# Patient Record
Sex: Male | Born: 1985 | Race: White | Hispanic: No | Marital: Single | Smoking: Current every day smoker
Health system: Southern US, Community
[De-identification: ages and names within clinical notes are randomized; demographics above are authoritative.]

## PROBLEM LIST (undated history)

## (undated) DIAGNOSIS — F419 Anxiety disorder, unspecified: Secondary | ICD-10-CM

## (undated) DIAGNOSIS — K5900 Constipation, unspecified: Secondary | ICD-10-CM

## (undated) DIAGNOSIS — R569 Unspecified convulsions: Secondary | ICD-10-CM

## (undated) DIAGNOSIS — S39012A Strain of muscle, fascia and tendon of lower back, initial encounter: Secondary | ICD-10-CM

## (undated) HISTORY — DX: Constipation, unspecified: K59.00

## (undated) HISTORY — DX: Strain of muscle, fascia and tendon of lower back, initial encounter: S39.012A

## (undated) HISTORY — PX: OTHER SURGICAL HISTORY: SHX169

## (undated) HISTORY — DX: Unspecified convulsions: R56.9

## (undated) HISTORY — DX: Anxiety disorder, unspecified: F41.9

---

## 2003-06-11 ENCOUNTER — Ambulatory Visit (HOSPITAL_COMMUNITY): Admission: RE | Admit: 2003-06-11 | Discharge: 2003-06-11 | Payer: Self-pay | Admitting: Family Medicine

## 2004-09-22 ENCOUNTER — Emergency Department (HOSPITAL_COMMUNITY): Admission: EM | Admit: 2004-09-22 | Discharge: 2004-09-22 | Payer: Self-pay | Admitting: *Deleted

## 2006-03-04 IMAGING — CR DG CHEST 2V
3 series · 3 of 3 positions shown · non-contrast
Comparison: none

CLINICAL DATA: Short of breath.
 CHEST - 2 VIEW:
 The heart size and mediastinal contours are within normal limits.  Both lungs are clear.  The visualized skeletal structures are unremarkable.

[view not recorded (1 of 3)]
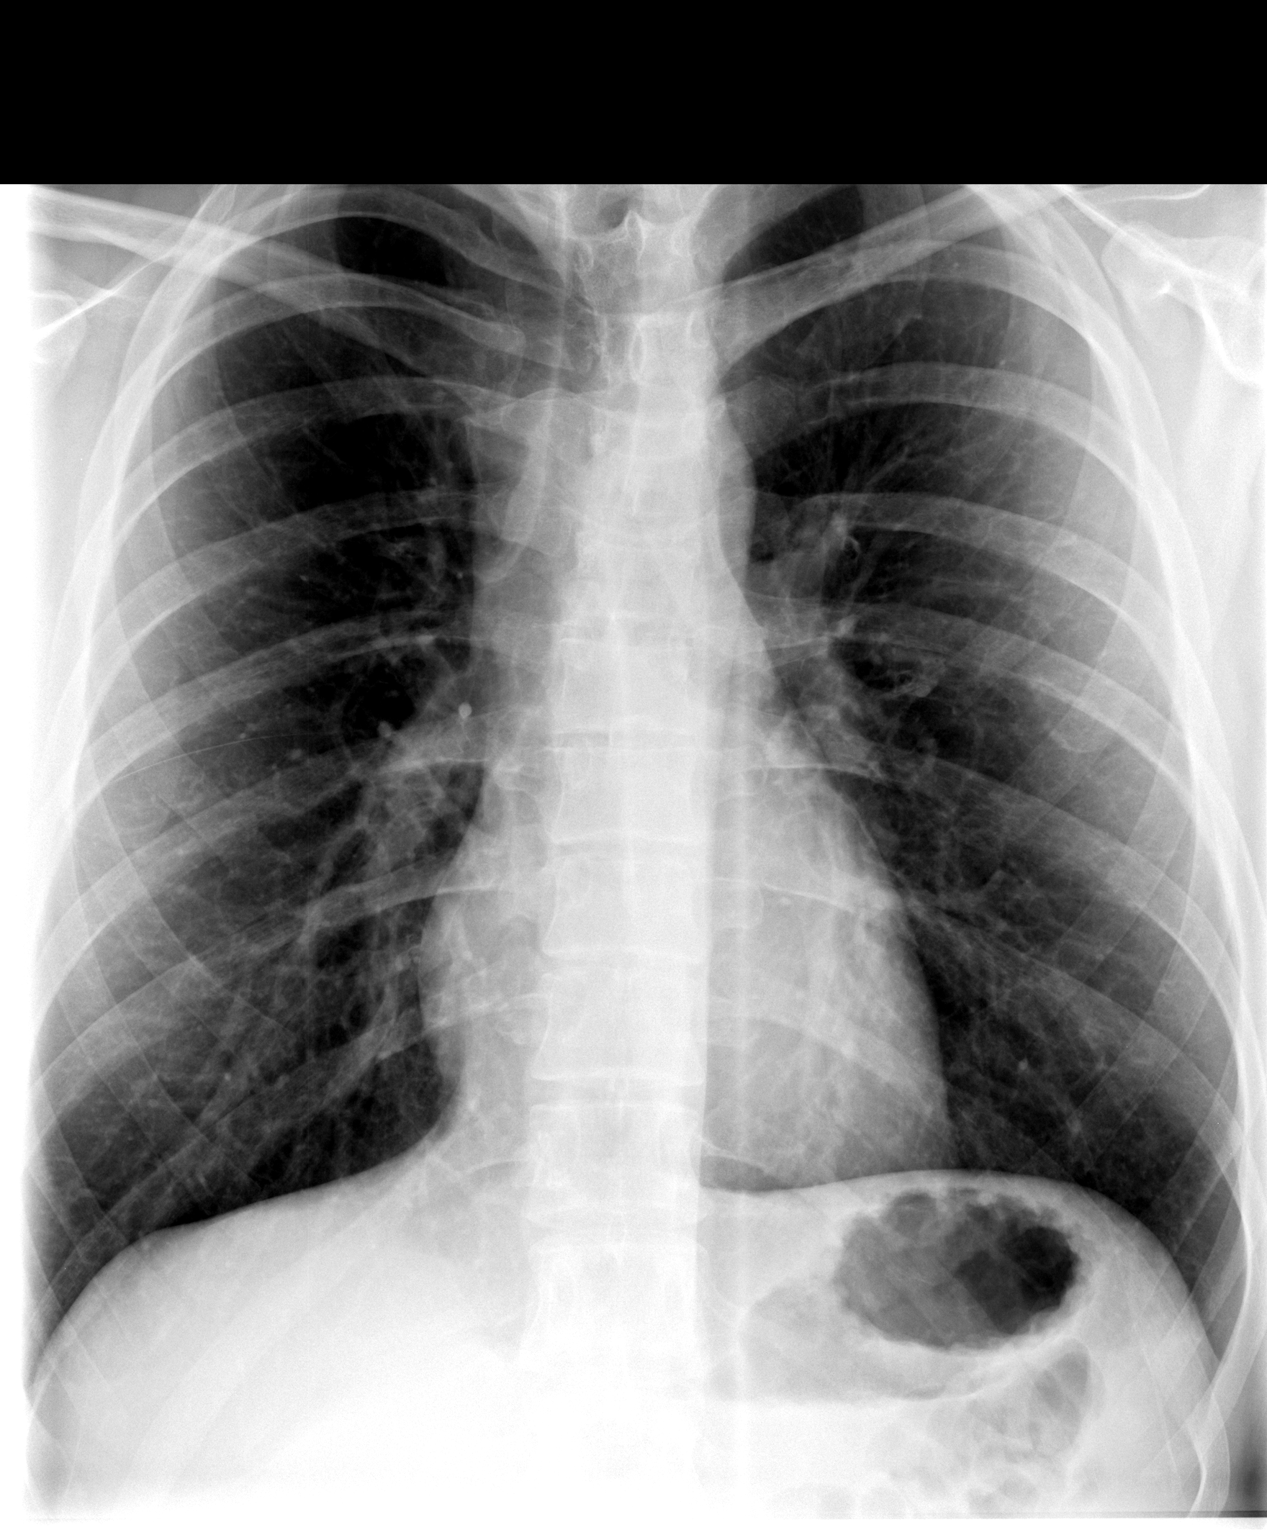

[view not recorded (2 of 3)]
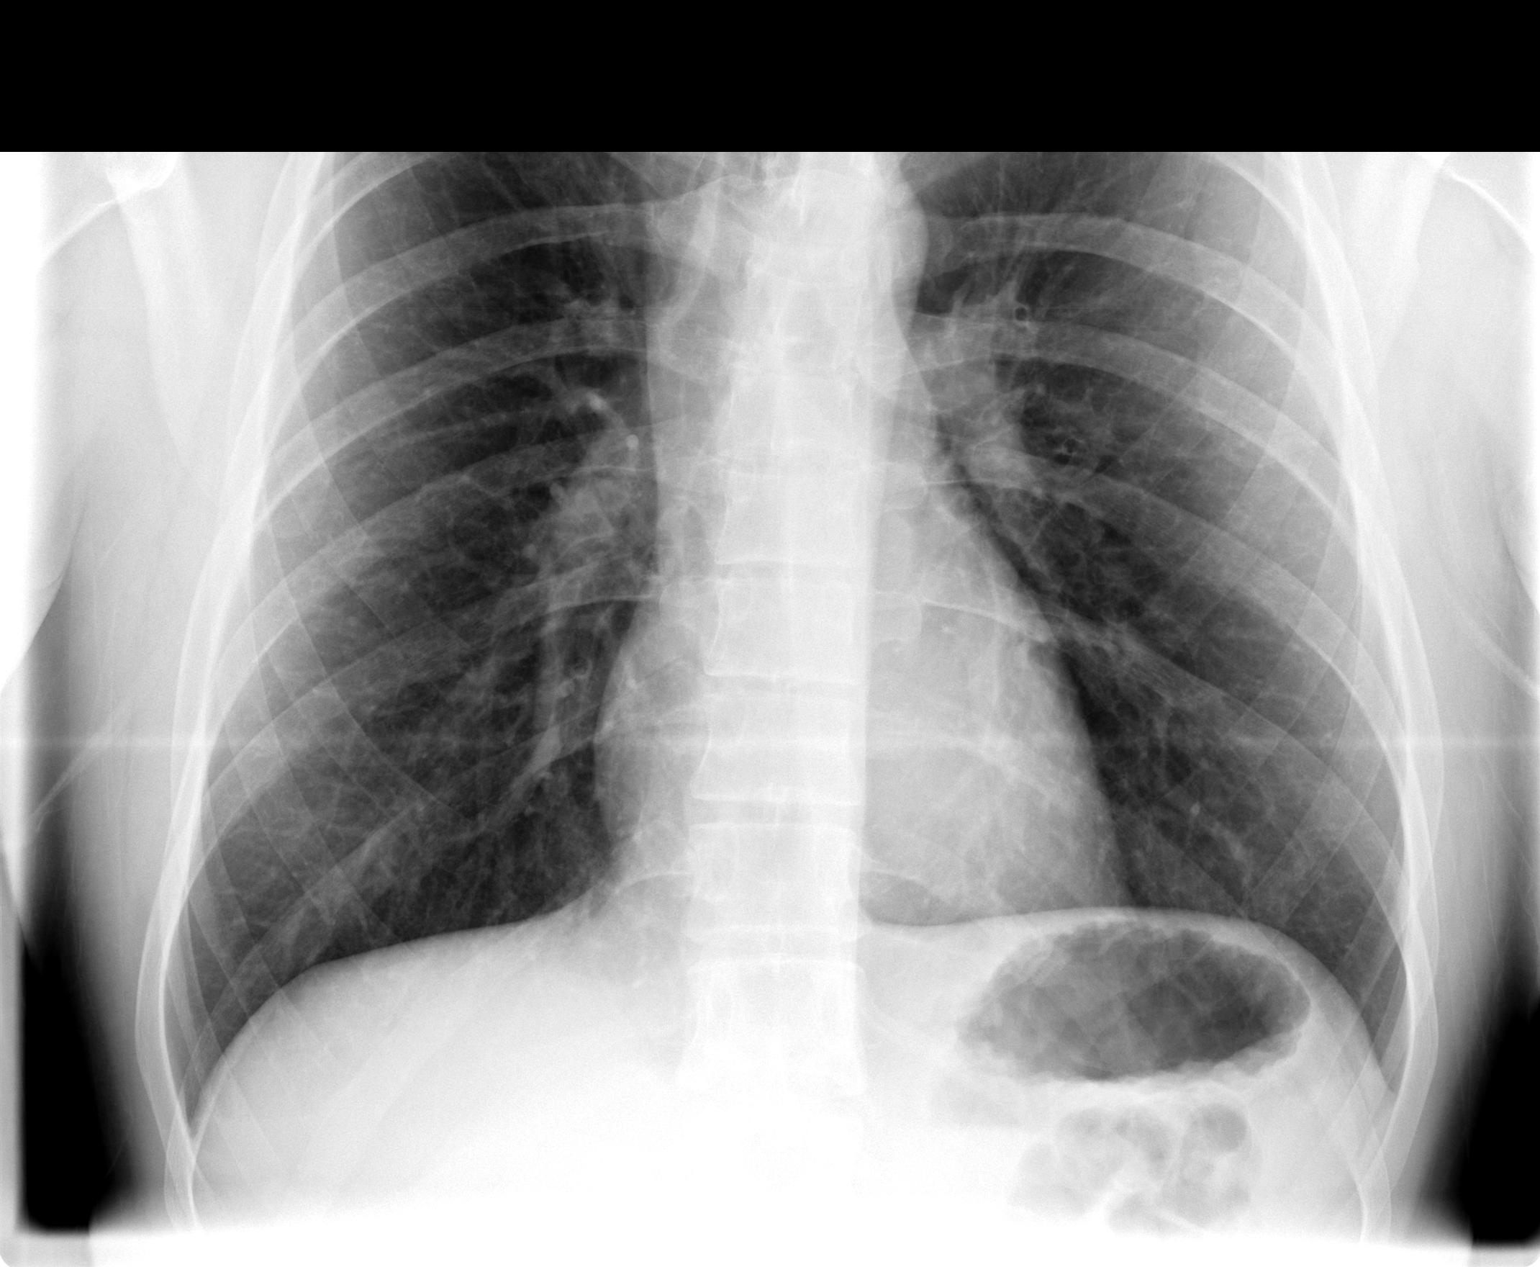

[view not recorded (3 of 3)]
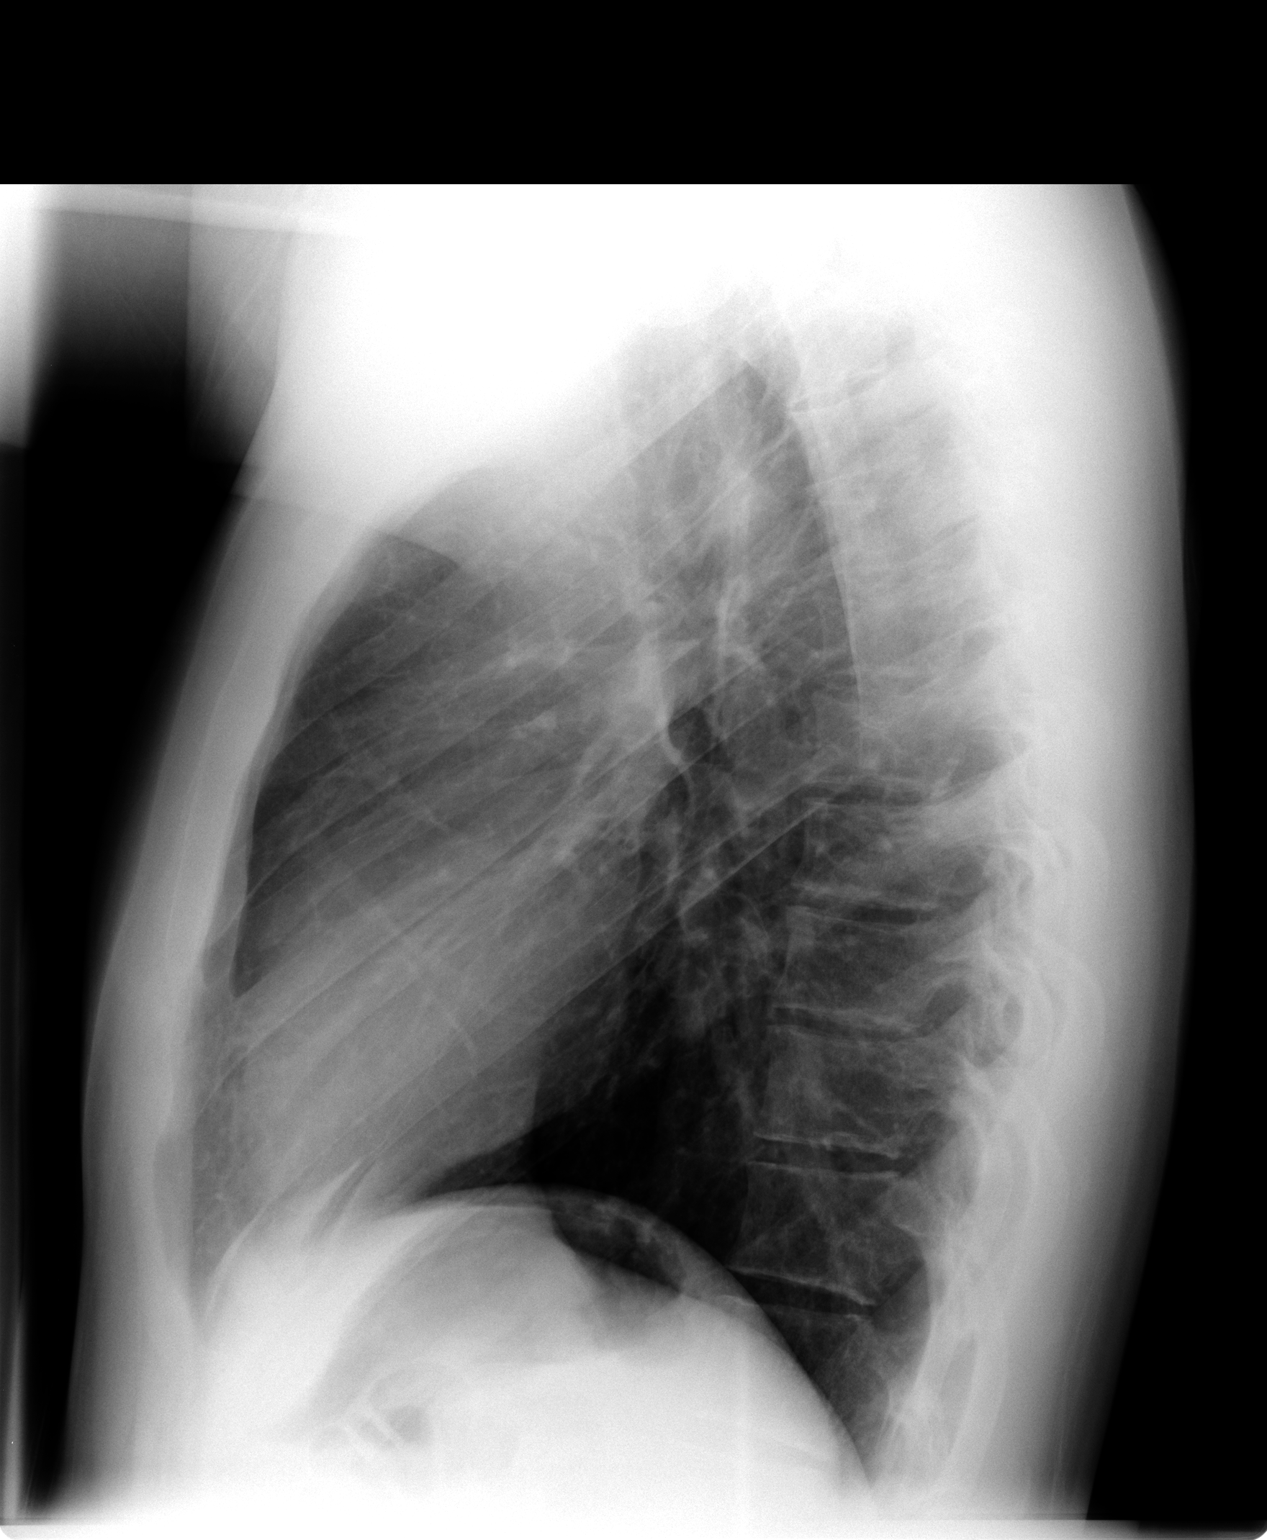

[3 of 3 positions shown; findings below may reference images not displayed]

IMPRESSION: No active cardiopulmonary disease.

## 2008-09-14 ENCOUNTER — Emergency Department (HOSPITAL_COMMUNITY): Admission: EM | Admit: 2008-09-14 | Discharge: 2008-09-15 | Payer: Self-pay | Admitting: Emergency Medicine

## 2010-08-01 LAB — URINALYSIS, ROUTINE W REFLEX MICROSCOPIC
Glucose, UA: NEGATIVE mg/dL
Hgb urine dipstick: NEGATIVE
Nitrite: NEGATIVE
Protein, ur: NEGATIVE mg/dL
Specific Gravity, Urine: 1.031 — ABNORMAL HIGH (ref 1.005–1.030)
Urobilinogen, UA: 0.2 mg/dL (ref 0.0–1.0)
pH: 6 (ref 5.0–8.0)

## 2010-08-01 LAB — CBC
HCT: 47.1 % (ref 39.0–52.0)
Hemoglobin: 15.8 g/dL (ref 13.0–17.0)
MCHC: 33.4 g/dL (ref 30.0–36.0)
MCV: 89.9 fL (ref 78.0–100.0)
Platelets: 196 10*3/uL (ref 150–400)
RBC: 5.24 MIL/uL (ref 4.22–5.81)
RDW: 14.2 % (ref 11.5–15.5)
WBC: 13.9 10*3/uL — ABNORMAL HIGH (ref 4.0–10.5)

## 2010-08-01 LAB — COMPREHENSIVE METABOLIC PANEL
ALT: 16 U/L (ref 0–53)
AST: 18 U/L (ref 0–37)
Albumin: 4.3 g/dL (ref 3.5–5.2)
Alkaline Phosphatase: 52 U/L (ref 39–117)
BUN: 9 mg/dL (ref 6–23)
CO2: 28 mEq/L (ref 19–32)
Calcium: 9.4 mg/dL (ref 8.4–10.5)
Chloride: 103 mEq/L (ref 96–112)
Creatinine, Ser: 1.16 mg/dL (ref 0.4–1.5)
GFR calc Af Amer: >60 mL/min (ref >60)
GFR calc non Af Amer: >60 mL/min (ref >60)
Glucose, Bld: 93 mg/dL (ref 70–99)
Potassium: 3.7 mEq/L (ref 3.5–5.1)
Sodium: 137 mEq/L (ref 135–145)
Total Bilirubin: 0.8 mg/dL (ref 0.3–1.2)
Total Protein: 7.1 g/dL (ref 6.0–8.3)

## 2010-08-01 LAB — DIFFERENTIAL
Basophils Absolute: 0.1 10*3/uL (ref 0.0–0.1)
Basophils Relative: 1 % (ref 0–1)
Eosinophils Absolute: 0.1 10*3/uL (ref 0.0–0.7)
Eosinophils Relative: 1 % (ref 0–5)
Lymphocytes Relative: 13 % (ref 12–46)
Lymphs Abs: 1.8 10*3/uL (ref 0.7–4.0)
Monocytes Absolute: 0.9 10*3/uL (ref 0.1–1.0)
Monocytes Relative: 6 % (ref 3–12)
Neutro Abs: 11.1 10*3/uL — ABNORMAL HIGH (ref 1.7–7.7)
Neutrophils Relative %: 80 % — ABNORMAL HIGH (ref 43–77)

## 2010-08-01 LAB — RAPID URINE DRUG SCREEN, HOSP PERFORMED
Amphetamines: POSITIVE — AB
Barbiturates: NOT DETECTED
Benzodiazepines: POSITIVE — AB
Cocaine: POSITIVE — AB
Opiates: POSITIVE — AB
Tetrahydrocannabinol: POSITIVE — AB

## 2010-08-01 LAB — ETHANOL: Alcohol, Ethyl (B): <5 mg/dL (ref 0–10)

## 2012-11-06 ENCOUNTER — Encounter: Payer: Self-pay | Admitting: Neurology

## 2012-11-06 ENCOUNTER — Ambulatory Visit (INDEPENDENT_AMBULATORY_CARE_PROVIDER_SITE_OTHER): Payer: 59 | Admitting: Neurology

## 2012-11-06 VITALS — BP 113/75 | HR 71 | Ht 74.0 in | Wt 205.5 lb

## 2012-11-06 DIAGNOSIS — R569 Unspecified convulsions: Secondary | ICD-10-CM | POA: Insufficient documentation

## 2012-11-06 DIAGNOSIS — F419 Anxiety disorder, unspecified: Secondary | ICD-10-CM | POA: Insufficient documentation

## 2012-11-06 DIAGNOSIS — S39012A Strain of muscle, fascia and tendon of lower back, initial encounter: Secondary | ICD-10-CM | POA: Insufficient documentation

## 2012-11-06 DIAGNOSIS — G8929 Other chronic pain: Secondary | ICD-10-CM

## 2012-11-06 DIAGNOSIS — M545 Low back pain, unspecified: Secondary | ICD-10-CM

## 2012-11-06 MED ORDER — DIVALPROEX SODIUM ER 500 MG PO TB24: 500.0000 mg | ORAL_TABLET | Freq: Every day | ORAL | Status: DC

## 2012-11-06 NOTE — Patient Instructions (Addendum)
Seizure, Adult A seizure is abnormal electrical activity in the brain. Seizures can cause a change in attention or behavior (altered mental status). Seizures often involve uncontrollable shaking (convulsions). Seizures usually last from 30 seconds to 2 minutes. Epilepsy is a brain disorder in which a patient has repeated seizures over time. CAUSES  There are many different problems that can cause seizures. In some cases, no cause is found. Common causes of seizures include:  Head injuries.  Brain tumors.  Infections.  Imbalance of chemicals in the blood.  Kidney failure or liver failure.  Heart disease.  Drug abuse.  Stroke.  Withdrawal from certain drugs or alcohol.  Birth defects.  Malfunction of a neurosurgical device placed in the brain. SYMPTOMS  Symptoms vary depending on the part of the brain that is involved. Right before a seizure, you may have a warning (aura) that a seizure is about to occur. An aura may include the following symptoms:   Fear or anxiety.  Nausea.  Feeling like the room is spinning (vertigo).  Vision changes, such as seeing flashing lights or spots. Common symptoms during a seizure include:  Convulsions.  Drooling.  Rapid eye movements.  Grunting.  Loss of bladder and bowel control.  Bitter taste in the mouth. After a seizure, you may feel confused and sleepy. You may also have an injury resulting from convulsions during the seizure. DIAGNOSIS  Your caregiver will perform a physical exam and run some tests to determine the type and cause of your seizure. These tests may include:  Blood tests.  A lumbar puncture test. In this test, a small amount of fluid is removed from the spine and examined.  Electrocardiography (ECG). This test records the electrical activity in your heart.  Imaging tests, such as computed tomography (CT) scans or magnetic resonance imaging (MRI).  Electroencephalography (EEG). This test records the electrical  activity in your brain. TREATMENT  Seizures usually stop on their own. Treatment will depend on the cause of your seizure. In some cases, medicine may be given to prevent future seizures. HOME CARE INSTRUCTIONS   If you are given medicines, take them exactly as prescribed by your caregiver.  Keep all follow-up appointments as directed by your caregiver.  Do not swim or drive until your caregiver says it is okay.  Teach friends and family what to do if you have a seizure. They should:  Lay you on the ground to prevent a fall.  Put a cushion under your head.  Loosen any tight clothing around your neck.  Turn you on your side. If vomiting occurs, this helps keep your airway clear.  Stay with you until you recover. SEEK IMMEDIATE MEDICAL CARE IF:  The seizure lasts longer than 2 to 5 minutes.  The seizure is severe or the person does not wake up after the seizure.  The person has altered mental status. Drive the person to the emergency department or call your local emergency services (911 in [place] MAKE SURE YOU:  Understand these instructions.  Will watch your condition.  Will get help right away if you are not doing well or get worse. Document Released: 04/06/2000 Document Revised: 07/02/2011 Document Reviewed: 03/28/2011 [facility] Patient Information 2014 [place], [place]. Electroencephalography Your caregiver has ordered an EEG for you. This is a test which records the electrical activity (brain waves) in your brain. It gives information about how your brain is working. It cannot read your thoughts, ideas, or knowledge. The test usually lasts for an hour and one  half. PREPARATION FOR EXAM  Your hair must be clean and dry.  Do not use hair spray, oils, or tease your hair the day of the test.  Take your medicine unless your caregiver has instructed otherwise.  Eat regularly unless instructed otherwise.  Avoid caffeine four hours prior to test.  If your caregiver has  ordered a "sleep deprived EEG", you may be asked to sleep less or stay awake the night before the procedure.  Other special instructions may be given. METHOD OF TESTING You will be seated in a comfortable chair and small metal discs called electrodes will be attached to your head with an adhesive (like glue). These electrodes pick up the electrical activity of your brain and are amplified in the electroencephalograph machine. These signals are then printed out on paper. There may be minor, very minimal discomfort associated with electrode placement. DURING THE TEST YOU WILL BE ASKED TO:  Lie quietly and relax.  Open and close your eyes.  Breathe deeply for three minutes.  Look at a flashing light for a short period of time. FOLLOWING THE TEST This is a complex test that may take some time to interpret. You will not find out the results on the day of the test. The electrodes will be removed with a solvent solution such as acetone or fingernail polish remover. You may then return to normal activities. A neurologist (nerve disorder specialist) will interpret your test and send a report to your caregiver. Find out how you are to receive your results. Remember it is your responsibility to obtain your results. Do not assume that everything is normal simply because you have not heard from your caregiver. Document Released: 04/06/2000 Document Revised: 07/02/2011 Document Reviewed: 06/09/2008 [facility] Patient Information 2014 [place], [place].
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
## 2013-05-14 ENCOUNTER — Ambulatory Visit: Payer: 59 | Admitting: Neurology

## 2015-07-29 DIAGNOSIS — F411 Generalized anxiety disorder: Secondary | ICD-10-CM | POA: Diagnosis not present

## 2015-07-29 DIAGNOSIS — Z6821 Body mass index (BMI) 21.0-21.9, adult: Secondary | ICD-10-CM | POA: Diagnosis not present

## 2015-08-12 DIAGNOSIS — R509 Fever, unspecified: Secondary | ICD-10-CM | POA: Diagnosis not present

## 2015-08-12 DIAGNOSIS — Z6820 Body mass index (BMI) 20.0-20.9, adult: Secondary | ICD-10-CM | POA: Diagnosis not present

## 2015-08-12 DIAGNOSIS — R1110 Vomiting, unspecified: Secondary | ICD-10-CM | POA: Diagnosis not present

## 2015-10-26 DIAGNOSIS — F411 Generalized anxiety disorder: Secondary | ICD-10-CM | POA: Diagnosis not present

## 2015-10-26 DIAGNOSIS — Z6821 Body mass index (BMI) 21.0-21.9, adult: Secondary | ICD-10-CM | POA: Diagnosis not present

## 2016-01-02 DIAGNOSIS — L02411 Cutaneous abscess of right axilla: Secondary | ICD-10-CM | POA: Diagnosis not present

## 2016-01-25 DIAGNOSIS — Z1389 Encounter for screening for other disorder: Secondary | ICD-10-CM | POA: Diagnosis not present

## 2016-01-25 DIAGNOSIS — Z6821 Body mass index (BMI) 21.0-21.9, adult: Secondary | ICD-10-CM | POA: Diagnosis not present

## 2016-01-25 DIAGNOSIS — F411 Generalized anxiety disorder: Secondary | ICD-10-CM | POA: Diagnosis not present

## 2016-03-14 DIAGNOSIS — Z23 Encounter for immunization: Secondary | ICD-10-CM | POA: Diagnosis not present

## 2016-05-02 DIAGNOSIS — F411 Generalized anxiety disorder: Secondary | ICD-10-CM | POA: Diagnosis not present

## 2016-05-02 DIAGNOSIS — Z6821 Body mass index (BMI) 21.0-21.9, adult: Secondary | ICD-10-CM | POA: Diagnosis not present

## 2016-05-02 DIAGNOSIS — Z202 Contact with and (suspected) exposure to infections with a predominantly sexual mode of transmission: Secondary | ICD-10-CM | POA: Diagnosis not present

## 2016-05-02 DIAGNOSIS — N4889 Other specified disorders of penis: Secondary | ICD-10-CM | POA: Diagnosis not present

## 2016-05-05 DIAGNOSIS — Z202 Contact with and (suspected) exposure to infections with a predominantly sexual mode of transmission: Secondary | ICD-10-CM | POA: Diagnosis not present

## 2016-05-30 DIAGNOSIS — L03031 Cellulitis of right toe: Secondary | ICD-10-CM | POA: Diagnosis not present

## 2016-05-30 DIAGNOSIS — Z6821 Body mass index (BMI) 21.0-21.9, adult: Secondary | ICD-10-CM | POA: Diagnosis not present

## 2016-05-30 DIAGNOSIS — F411 Generalized anxiety disorder: Secondary | ICD-10-CM | POA: Diagnosis not present

## 2016-06-12 DIAGNOSIS — Z6821 Body mass index (BMI) 21.0-21.9, adult: Secondary | ICD-10-CM | POA: Diagnosis not present

## 2016-06-12 DIAGNOSIS — L03031 Cellulitis of right toe: Secondary | ICD-10-CM | POA: Diagnosis not present

## 2016-06-21 DIAGNOSIS — M79674 Pain in right toe(s): Secondary | ICD-10-CM | POA: Diagnosis not present

## 2016-06-21 DIAGNOSIS — L03031 Cellulitis of right toe: Secondary | ICD-10-CM | POA: Diagnosis not present

## 2016-06-21 DIAGNOSIS — L600 Ingrowing nail: Secondary | ICD-10-CM | POA: Diagnosis not present

## 2016-06-21 DIAGNOSIS — M79671 Pain in right foot: Secondary | ICD-10-CM | POA: Diagnosis not present

## 2016-07-17 DIAGNOSIS — F411 Generalized anxiety disorder: Secondary | ICD-10-CM | POA: Diagnosis not present

## 2016-07-17 DIAGNOSIS — J111 Influenza due to unidentified influenza virus with other respiratory manifestations: Secondary | ICD-10-CM | POA: Diagnosis not present

## 2016-07-17 DIAGNOSIS — R634 Abnormal weight loss: Secondary | ICD-10-CM | POA: Diagnosis not present

## 2016-07-17 DIAGNOSIS — Z6820 Body mass index (BMI) 20.0-20.9, adult: Secondary | ICD-10-CM | POA: Diagnosis not present

## 2016-07-23 DIAGNOSIS — R05 Cough: Secondary | ICD-10-CM | POA: Diagnosis not present

## 2016-07-23 DIAGNOSIS — J1089 Influenza due to other identified influenza virus with other manifestations: Secondary | ICD-10-CM | POA: Diagnosis not present

## 2016-07-23 DIAGNOSIS — J111 Influenza due to unidentified influenza virus with other respiratory manifestations: Secondary | ICD-10-CM | POA: Diagnosis not present

## 2016-08-27 DIAGNOSIS — Z6821 Body mass index (BMI) 21.0-21.9, adult: Secondary | ICD-10-CM | POA: Diagnosis not present

## 2016-08-27 DIAGNOSIS — F411 Generalized anxiety disorder: Secondary | ICD-10-CM | POA: Diagnosis not present

## 2016-09-13 DIAGNOSIS — D485 Neoplasm of uncertain behavior of skin: Secondary | ICD-10-CM | POA: Diagnosis not present

## 2016-09-13 DIAGNOSIS — D229 Melanocytic nevi, unspecified: Secondary | ICD-10-CM

## 2016-09-13 HISTORY — DX: Melanocytic nevi, unspecified: D22.9

## 2016-10-11 DIAGNOSIS — Z6821 Body mass index (BMI) 21.0-21.9, adult: Secondary | ICD-10-CM | POA: Diagnosis not present

## 2016-10-11 DIAGNOSIS — H11422 Conjunctival edema, left eye: Secondary | ICD-10-CM | POA: Diagnosis not present

## 2016-10-11 DIAGNOSIS — H44131 Sympathetic uveitis, right eye: Secondary | ICD-10-CM | POA: Diagnosis not present

## 2016-12-19 DIAGNOSIS — F411 Generalized anxiety disorder: Secondary | ICD-10-CM | POA: Diagnosis not present

## 2016-12-19 DIAGNOSIS — Z6823 Body mass index (BMI) 23.0-23.9, adult: Secondary | ICD-10-CM | POA: Diagnosis not present

## 2017-01-16 DIAGNOSIS — L03032 Cellulitis of left toe: Secondary | ICD-10-CM | POA: Diagnosis not present

## 2017-01-16 DIAGNOSIS — L600 Ingrowing nail: Secondary | ICD-10-CM | POA: Diagnosis not present

## 2017-01-16 DIAGNOSIS — M79672 Pain in left foot: Secondary | ICD-10-CM | POA: Diagnosis not present

## 2017-01-16 DIAGNOSIS — M79675 Pain in left toe(s): Secondary | ICD-10-CM | POA: Diagnosis not present

## 2017-01-30 DIAGNOSIS — M79672 Pain in left foot: Secondary | ICD-10-CM | POA: Diagnosis not present

## 2017-01-30 DIAGNOSIS — L600 Ingrowing nail: Secondary | ICD-10-CM | POA: Diagnosis not present

## 2017-01-30 DIAGNOSIS — M79675 Pain in left toe(s): Secondary | ICD-10-CM | POA: Diagnosis not present

## 2017-01-30 DIAGNOSIS — L03032 Cellulitis of left toe: Secondary | ICD-10-CM | POA: Diagnosis not present

## 2017-03-28 DIAGNOSIS — R112 Nausea with vomiting, unspecified: Secondary | ICD-10-CM | POA: Diagnosis not present

## 2017-03-28 DIAGNOSIS — J069 Acute upper respiratory infection, unspecified: Secondary | ICD-10-CM | POA: Diagnosis not present

## 2017-03-28 DIAGNOSIS — R509 Fever, unspecified: Secondary | ICD-10-CM | POA: Diagnosis not present

## 2017-03-28 DIAGNOSIS — Z6823 Body mass index (BMI) 23.0-23.9, adult: Secondary | ICD-10-CM | POA: Diagnosis not present

## 2017-04-09 DIAGNOSIS — Z6824 Body mass index (BMI) 24.0-24.9, adult: Secondary | ICD-10-CM | POA: Diagnosis not present

## 2017-04-09 DIAGNOSIS — F411 Generalized anxiety disorder: Secondary | ICD-10-CM | POA: Diagnosis not present

## 2017-05-31 DIAGNOSIS — F1121 Opioid dependence, in remission: Secondary | ICD-10-CM | POA: Diagnosis not present

## 2017-05-31 DIAGNOSIS — Z79891 Long term (current) use of opiate analgesic: Secondary | ICD-10-CM | POA: Diagnosis not present

## 2017-06-03 DIAGNOSIS — F1121 Opioid dependence, in remission: Secondary | ICD-10-CM | POA: Diagnosis not present

## 2017-06-03 DIAGNOSIS — Z79891 Long term (current) use of opiate analgesic: Secondary | ICD-10-CM | POA: Diagnosis not present

## 2017-06-25 DIAGNOSIS — Z79899 Other long term (current) drug therapy: Secondary | ICD-10-CM | POA: Diagnosis not present

## 2017-07-01 DIAGNOSIS — J029 Acute pharyngitis, unspecified: Secondary | ICD-10-CM | POA: Diagnosis not present

## 2017-07-01 DIAGNOSIS — J111 Influenza due to unidentified influenza virus with other respiratory manifestations: Secondary | ICD-10-CM | POA: Diagnosis not present

## 2017-07-01 DIAGNOSIS — Z6824 Body mass index (BMI) 24.0-24.9, adult: Secondary | ICD-10-CM | POA: Diagnosis not present

## 2017-07-02 DIAGNOSIS — Z79899 Other long term (current) drug therapy: Secondary | ICD-10-CM | POA: Diagnosis not present

## 2017-07-23 DIAGNOSIS — Z79899 Other long term (current) drug therapy: Secondary | ICD-10-CM | POA: Diagnosis not present

## 2017-07-30 DIAGNOSIS — Z79899 Other long term (current) drug therapy: Secondary | ICD-10-CM | POA: Diagnosis not present

## 2017-08-21 DIAGNOSIS — Z79891 Long term (current) use of opiate analgesic: Secondary | ICD-10-CM | POA: Diagnosis not present

## 2017-08-28 DIAGNOSIS — Z79891 Long term (current) use of opiate analgesic: Secondary | ICD-10-CM | POA: Diagnosis not present

## 2017-09-05 DIAGNOSIS — H11421 Conjunctival edema, right eye: Secondary | ICD-10-CM | POA: Diagnosis not present

## 2017-10-04 DIAGNOSIS — F411 Generalized anxiety disorder: Secondary | ICD-10-CM | POA: Diagnosis not present

## 2017-10-04 DIAGNOSIS — Z6825 Body mass index (BMI) 25.0-25.9, adult: Secondary | ICD-10-CM | POA: Diagnosis not present

## 2017-10-21 DIAGNOSIS — Z833 Family history of diabetes mellitus: Secondary | ICD-10-CM | POA: Diagnosis not present

## 2017-10-21 DIAGNOSIS — Z888 Allergy status to other drugs, medicaments and biological substances status: Secondary | ICD-10-CM | POA: Diagnosis not present

## 2017-10-21 DIAGNOSIS — R112 Nausea with vomiting, unspecified: Secondary | ICD-10-CM | POA: Diagnosis not present

## 2017-10-21 DIAGNOSIS — K2970 Gastritis, unspecified, without bleeding: Secondary | ICD-10-CM | POA: Diagnosis not present

## 2017-10-21 DIAGNOSIS — F17200 Nicotine dependence, unspecified, uncomplicated: Secondary | ICD-10-CM | POA: Diagnosis not present

## 2017-10-21 DIAGNOSIS — R509 Fever, unspecified: Secondary | ICD-10-CM | POA: Diagnosis not present

## 2017-10-21 DIAGNOSIS — Z811 Family history of alcohol abuse and dependence: Secondary | ICD-10-CM | POA: Diagnosis not present

## 2017-10-21 DIAGNOSIS — Z820 Family history of epilepsy and other diseases of the nervous system: Secondary | ICD-10-CM | POA: Diagnosis not present

## 2017-10-21 DIAGNOSIS — Z809 Family history of malignant neoplasm, unspecified: Secondary | ICD-10-CM | POA: Diagnosis not present

## 2017-10-21 DIAGNOSIS — Z8249 Family history of ischemic heart disease and other diseases of the circulatory system: Secondary | ICD-10-CM | POA: Diagnosis not present

## 2017-10-21 DIAGNOSIS — K529 Noninfective gastroenteritis and colitis, unspecified: Secondary | ICD-10-CM | POA: Diagnosis not present

## 2017-10-21 DIAGNOSIS — R1110 Vomiting, unspecified: Secondary | ICD-10-CM | POA: Diagnosis not present

## 2017-10-21 DIAGNOSIS — Z8489 Family history of other specified conditions: Secondary | ICD-10-CM | POA: Diagnosis not present

## 2017-10-21 DIAGNOSIS — R109 Unspecified abdominal pain: Secondary | ICD-10-CM | POA: Diagnosis not present

## 2017-10-21 DIAGNOSIS — Z818 Family history of other mental and behavioral disorders: Secondary | ICD-10-CM | POA: Diagnosis not present

## 2017-10-21 DIAGNOSIS — F1910 Other psychoactive substance abuse, uncomplicated: Secondary | ICD-10-CM | POA: Diagnosis not present

## 2017-10-21 DIAGNOSIS — G894 Chronic pain syndrome: Secondary | ICD-10-CM | POA: Diagnosis not present

## 2017-10-21 DIAGNOSIS — Z79899 Other long term (current) drug therapy: Secondary | ICD-10-CM | POA: Diagnosis not present

## 2017-10-22 DIAGNOSIS — R509 Fever, unspecified: Secondary | ICD-10-CM | POA: Diagnosis not present

## 2017-10-22 DIAGNOSIS — K529 Noninfective gastroenteritis and colitis, unspecified: Secondary | ICD-10-CM | POA: Diagnosis not present

## 2017-10-23 DIAGNOSIS — K529 Noninfective gastroenteritis and colitis, unspecified: Secondary | ICD-10-CM | POA: Diagnosis not present

## 2017-10-23 DIAGNOSIS — R509 Fever, unspecified: Secondary | ICD-10-CM | POA: Diagnosis not present

## 2017-10-24 DIAGNOSIS — K529 Noninfective gastroenteritis and colitis, unspecified: Secondary | ICD-10-CM | POA: Diagnosis not present

## 2017-10-24 DIAGNOSIS — G894 Chronic pain syndrome: Secondary | ICD-10-CM | POA: Diagnosis not present

## 2017-10-24 DIAGNOSIS — F1910 Other psychoactive substance abuse, uncomplicated: Secondary | ICD-10-CM | POA: Diagnosis not present

## 2017-10-24 DIAGNOSIS — R509 Fever, unspecified: Secondary | ICD-10-CM | POA: Diagnosis not present

## 2017-10-31 DIAGNOSIS — Z79891 Long term (current) use of opiate analgesic: Secondary | ICD-10-CM | POA: Diagnosis not present

## 2017-11-01 DIAGNOSIS — K529 Noninfective gastroenteritis and colitis, unspecified: Secondary | ICD-10-CM | POA: Diagnosis not present

## 2017-11-20 DIAGNOSIS — Z79891 Long term (current) use of opiate analgesic: Secondary | ICD-10-CM | POA: Diagnosis not present

## 2017-11-23 DIAGNOSIS — K529 Noninfective gastroenteritis and colitis, unspecified: Secondary | ICD-10-CM | POA: Diagnosis not present

## 2017-11-27 DIAGNOSIS — Z79891 Long term (current) use of opiate analgesic: Secondary | ICD-10-CM | POA: Diagnosis not present

## 2017-12-25 DIAGNOSIS — Z79891 Long term (current) use of opiate analgesic: Secondary | ICD-10-CM | POA: Diagnosis not present

## 2018-01-22 DIAGNOSIS — Z79891 Long term (current) use of opiate analgesic: Secondary | ICD-10-CM | POA: Diagnosis not present

## 2018-02-19 DIAGNOSIS — Z79891 Long term (current) use of opiate analgesic: Secondary | ICD-10-CM | POA: Diagnosis not present

## 2018-03-19 DIAGNOSIS — Z79891 Long term (current) use of opiate analgesic: Secondary | ICD-10-CM | POA: Diagnosis not present

## 2018-03-28 DIAGNOSIS — Z1331 Encounter for screening for depression: Secondary | ICD-10-CM | POA: Diagnosis not present

## 2018-03-28 DIAGNOSIS — F411 Generalized anxiety disorder: Secondary | ICD-10-CM | POA: Diagnosis not present

## 2018-03-28 DIAGNOSIS — Z23 Encounter for immunization: Secondary | ICD-10-CM | POA: Diagnosis not present

## 2018-03-28 DIAGNOSIS — G252 Other specified forms of tremor: Secondary | ICD-10-CM | POA: Diagnosis not present

## 2018-03-28 DIAGNOSIS — Z6827 Body mass index (BMI) 27.0-27.9, adult: Secondary | ICD-10-CM | POA: Diagnosis not present

## 2018-06-11 DIAGNOSIS — Z79891 Long term (current) use of opiate analgesic: Secondary | ICD-10-CM | POA: Diagnosis not present

## 2018-07-09 DIAGNOSIS — F1121 Opioid dependence, in remission: Secondary | ICD-10-CM | POA: Diagnosis not present

## 2018-07-09 DIAGNOSIS — Z79899 Other long term (current) drug therapy: Secondary | ICD-10-CM | POA: Diagnosis not present

## 2018-08-06 DIAGNOSIS — Z79899 Other long term (current) drug therapy: Secondary | ICD-10-CM | POA: Diagnosis not present

## 2018-09-03 DIAGNOSIS — F1121 Opioid dependence, in remission: Secondary | ICD-10-CM | POA: Diagnosis not present

## 2018-09-03 DIAGNOSIS — Z79899 Other long term (current) drug therapy: Secondary | ICD-10-CM | POA: Diagnosis not present

## 2018-09-03 DIAGNOSIS — F329 Major depressive disorder, single episode, unspecified: Secondary | ICD-10-CM | POA: Diagnosis not present

## 2018-09-23 DIAGNOSIS — F411 Generalized anxiety disorder: Secondary | ICD-10-CM | POA: Diagnosis not present

## 2018-09-23 DIAGNOSIS — Z6826 Body mass index (BMI) 26.0-26.9, adult: Secondary | ICD-10-CM | POA: Diagnosis not present

## 2018-10-01 DIAGNOSIS — Z79899 Other long term (current) drug therapy: Secondary | ICD-10-CM | POA: Diagnosis not present

## 2018-10-01 DIAGNOSIS — F329 Major depressive disorder, single episode, unspecified: Secondary | ICD-10-CM | POA: Diagnosis not present

## 2018-10-01 DIAGNOSIS — F411 Generalized anxiety disorder: Secondary | ICD-10-CM | POA: Diagnosis not present

## 2018-10-29 DIAGNOSIS — F329 Major depressive disorder, single episode, unspecified: Secondary | ICD-10-CM | POA: Diagnosis not present

## 2018-10-29 DIAGNOSIS — F1121 Opioid dependence, in remission: Secondary | ICD-10-CM | POA: Diagnosis not present

## 2018-11-26 DIAGNOSIS — F411 Generalized anxiety disorder: Secondary | ICD-10-CM | POA: Diagnosis not present

## 2018-11-26 DIAGNOSIS — F1120 Opioid dependence, uncomplicated: Secondary | ICD-10-CM | POA: Diagnosis not present

## 2018-11-26 DIAGNOSIS — Z79899 Other long term (current) drug therapy: Secondary | ICD-10-CM | POA: Diagnosis not present

## 2018-12-24 DIAGNOSIS — F1121 Opioid dependence, in remission: Secondary | ICD-10-CM | POA: Diagnosis not present

## 2018-12-24 DIAGNOSIS — F411 Generalized anxiety disorder: Secondary | ICD-10-CM | POA: Diagnosis not present

## 2018-12-24 DIAGNOSIS — F1120 Opioid dependence, uncomplicated: Secondary | ICD-10-CM | POA: Diagnosis not present

## 2018-12-24 DIAGNOSIS — Z79899 Other long term (current) drug therapy: Secondary | ICD-10-CM | POA: Diagnosis not present

## 2019-03-11 DIAGNOSIS — F411 Generalized anxiety disorder: Secondary | ICD-10-CM | POA: Diagnosis not present

## 2019-03-11 DIAGNOSIS — Z1331 Encounter for screening for depression: Secondary | ICD-10-CM | POA: Diagnosis not present

## 2019-03-11 DIAGNOSIS — Z202 Contact with and (suspected) exposure to infections with a predominantly sexual mode of transmission: Secondary | ICD-10-CM | POA: Diagnosis not present

## 2019-03-11 DIAGNOSIS — Z6825 Body mass index (BMI) 25.0-25.9, adult: Secondary | ICD-10-CM | POA: Diagnosis not present

## 2019-03-24 ENCOUNTER — Encounter: Payer: Self-pay | Admitting: Gastroenterology

## 2019-04-08 ENCOUNTER — Ambulatory Visit: Payer: BC Managed Care – PPO | Admitting: Gastroenterology

## 2019-04-08 ENCOUNTER — Encounter: Payer: Self-pay | Admitting: Gastroenterology

## 2019-04-08 ENCOUNTER — Encounter: Payer: Self-pay | Admitting: *Deleted

## 2019-04-08 ENCOUNTER — Other Ambulatory Visit: Payer: Self-pay

## 2019-04-08 ENCOUNTER — Ambulatory Visit: Payer: Self-pay | Admitting: Nurse Practitioner

## 2019-04-08 DIAGNOSIS — B182 Chronic viral hepatitis C: Secondary | ICD-10-CM | POA: Insufficient documentation

## 2019-04-08 NOTE — Progress Notes (Signed)
Primary Care Physician:  William Percy, MD Primary Gastroenterologist:  Dr. Oneida Jackson   Chief Complaint  Patient presents with  . Hepatitis C  . Elevated Hepatic Enzymes    HPI:   William Jackson is a 33 y.o. male presenting today at the request of Dr. Nadara Jackson due to elevated transaminases and positive Hep C antibody.   Labs from PCP dated Nov 2020 with BUN 12, creatinine 0.88, Tbili 0.8, Alk Phos 61, AST 135, ALT 231, positive Hep C antibody with positive qualitative RNA (no quantitative provided). Unknown genotype.   States he had donated blood in August and then found out he was positive for Hep C. Historically with prescription med addiction. No IV drug use. History of snorting meds 11 years ago. Stopped when found out his girlfriend was pregnant. Single dad of 1 year old with Down Syndrome. No tattoos, no prior blood transfusions.   Weaning off Suboxone. Concerned about health insurance and coverage due to high premiums. Notes Bristol stool #2, #1 at times. Working on own with OTC magnesium. If remembers to take, won't suffer from constipation.   No mental status changes, confusion, abdominal pain, jaundice.   Past Medical History:  Diagnosis Date  . Anxiety   . Constipation   . Lumbar spine strain   . Seizure [facility])     Past Surgical History:  Procedure Laterality Date  . none      Current Outpatient Medications  Medication Sig Dispense Refill  . ALPRAZolam (XANAX) 1 MG tablet Take 1 mg by mouth 3 (three) times daily.     . buprenorphine-naloxone (SUBOXONE) 8-2 mg SUBL SL tablet Place 0.5 tablets under the tongue 3 (three) times daily.     No current facility-administered medications for this visit.    Allergies as of 04/08/2019 - Review Complete 04/08/2019  Allergen Reaction Noted  . Penicillins  11/06/2012    Family History  Problem Relation Age of Onset  . Prostate cancer Father   . Colon cancer Neg Hx   . Colon polyps Neg Hx     Social History    Socioeconomic History  . Marital status: Single    Spouse name: Not on file  . Number of children: 1  . Years of education: 55  . Highest education level: Not on file  Occupational History    Comment: [employer], works in Paper  Tobacco Use  . Smoking status: Current Every Day Smoker  . Smokeless tobacco: Never Used  . Tobacco comment: vapes  Substance and Sexual Activity  . Alcohol use: No  . Drug use: No  . Sexual activity: Not on file  Other Topics Concern  . Not on file  Social History Narrative  . Not on file   Social Determinants of Health   Financial Resource Strain:   . Difficulty of Paying Living Expenses: Not on file  Food Insecurity:   . Worried About Charity fundraiser in the Last Year: Not on file  . Ran Out of Food in the Last Year: Not on file  Transportation Needs:   . Lack of Transportation (Medical): Not on file  . Lack of Transportation (Non-Medical): Not on file  Physical Activity:   . Days of Exercise per Week: Not on file  . Minutes of Exercise per Session: Not on file  Stress:   . Feeling of Stress : Not on file  Social Connections:   . Frequency of Communication with Friends and Family: Not on file  . Frequency  of Social Gatherings with Friends and Family: Not on file  . Attends Religious Services: Not on file  . Active Member of Clubs or Organizations: Not on file  . Attends Archivist Meetings: Not on file  . Marital Status: Not on file  Intimate Partner Violence:   . Fear of Current or Ex-Partner: Not on file  . Emotionally Abused: Not on file  . Physically Abused: Not on file  . Sexually Abused: Not on file    Review of Systems: Gen: Denies any fever, chills, fatigue, weight loss, lack of appetite.  CV: Denies chest pain, heart palpitations, peripheral edema, syncope.  Resp: Denies shortness of breath at rest or with exertion. Denies wheezing or cough.  GI: see HPI GU : Denies urinary burning, urinary frequency,  urinary hesitancy MS: Denies joint pain, muscle weakness, cramps, or limitation of movement.  Derm: Denies rash, itching, dry skin Psych: Denies depression, anxiety, memory loss, and confusion Heme: Denies bruising, bleeding, and enlarged lymph nodes.  Physical Exam: BP 108/66   Pulse 78   Temp (!) 96.8 F (36 C) (Temporal)   Ht _0  (1.905 m)   Wt 217 lb 9.6 oz (98.7 kg)   BMI 27.20 kg/m  General:   Alert and oriented. Pleasant and cooperative. Well-nourished and well-developed.  Head:  Normocephalic and atraumatic. Eyes:  Without icterus, sclera clear and conjunctiva pink.  Lungs:  Clear to auscultation bilaterally. No wheezes, rales, or rhonchi. No distress.  Heart:  S1, S2 present without murmurs appreciated.  Abdomen:  +BS, soft, non-tender and non-distended. No HSM noted. No guarding or rebound. No masses appreciated.  Rectal:  Deferred  Msk:  Symmetrical without gross deformities. Normal posture. Extremities:  Without edema. Neurologic:  Alert and  oriented x4;  grossly normal neurologically. Skin:  Intact without significant lesions or rashes. Psych:  Alert and cooperative. Normal mood and affect.  ASSESSMENT: William Jackson is a 32 y.o. male presenting today with a history of chronic Hepatitis C, unknown genotype, and desiring treatment. He is an excellent candidate, as he has remained without illicit drug use for 11 years and is highly motivated. Needs [place] elastography and additional labs prior to treatment. Hopefully, we can get this done in a timely manner and submit for approval in the next few weeks. I discussed with him the need for close follow-up, monitoring, serial blood work, and compliance with medication. He states understanding.    PLAN: [place] elastography Labs: HIV, Hep B serologies, Hep A, Hep C genotype, Hep C RNA, INR, CBC.  Further recommendations to follow   [name], PhD, ANP-BC [facility]

## 2019-04-08 NOTE — Patient Instructions (Signed)
Please have blood work done when you are able.  We are arranging an ultrasound in the near future.  We will submit to insurance for treatment once we get blood work and ultrasound back!  It was a pleasure to see you today. I want to create trusting relationships with patients to provide genuine, compassionate, and quality care. I value your feedback. If you receive a survey regarding your visit,  I greatly appreciate you taking time to fill this out.   [name], PhD, ANP-BC [facility]

## 2019-04-14 ENCOUNTER — Ambulatory Visit (HOSPITAL_COMMUNITY)
Admission: RE | Admit: 2019-04-14 | Discharge: 2019-04-14 | Disposition: A | Discharge status: Home / Self Care | Payer: BC Managed Care – PPO | Source: Ambulatory Visit | Attending: Gastroenterology | Admitting: Gastroenterology

## 2019-04-14 ENCOUNTER — Other Ambulatory Visit: Payer: Self-pay

## 2019-04-14 DIAGNOSIS — B182 Chronic viral hepatitis C: Secondary | ICD-10-CM | POA: Diagnosis not present

## 2019-04-22 ENCOUNTER — Telehealth: Payer: Self-pay | Admitting: Gastroenterology

## 2019-04-22 NOTE — Telephone Encounter (Signed)
Pt is inquiring about his CT results from 04/14/2019.

## 2019-04-22 NOTE — Telephone Encounter (Signed)
Pt called to see if his results were back. [phone]

## 2019-04-22 NOTE — Telephone Encounter (Signed)
Noted. Pt is aware 

## 2019-04-22 NOTE — Telephone Encounter (Signed)
Alicia: he completed an [place] elastography. Radiology reports on this differently than previously, so I will need to review the corresponding chart to interpret but will have this for him on 12/31.

## 2019-04-23 NOTE — Telephone Encounter (Signed)
Please see result note. Fibrosis approximately 2. No advanced liver disease. Still awaiting HCV RNA quantitative, which has been pending for about a week. Can we find out what is going on with this?

## 2019-04-23 NOTE — Telephone Encounter (Signed)
Results note sent to [facility]. [facility] is looking into why the test hasn't results.

## 2019-05-01 LAB — CBC WITH DIFFERENTIAL/PLATELET
Absolute Monocytes: 381 cells/uL (ref 200–950)
Basophils Absolute: 52 cells/uL (ref 0–200)
Basophils Relative: 1.4 %
Eosinophils Absolute: 152 cells/uL (ref 15–500)
Eosinophils Relative: 4.1 %
HCT: 39.5 % (ref 38.5–50.0)
Hemoglobin: 13 g/dL — ABNORMAL LOW (ref 13.2–17.1)
Lymphs Abs: 1454 cells/uL (ref 850–3900)
MCH: 29.9 pg (ref 27.0–33.0)
MCHC: 32.9 g/dL (ref 32.0–36.0)
MCV: 90.8 fL (ref 80.0–100.0)
MPV: 10.5 fL (ref 7.5–12.5)
Monocytes Relative: 10.3 %
Neutro Abs: 1661 cells/uL (ref 1500–7800)
Neutrophils Relative %: 44.9 %
Platelets: 179 10*3/uL (ref 140–400)
RBC: 4.35 10*6/uL (ref 4.20–5.80)
RDW: 13.6 % (ref 11.0–15.0)
Total Lymphocyte: 39.3 %
WBC: 3.7 10*3/uL — ABNORMAL LOW (ref 3.8–10.8)

## 2019-05-01 LAB — HEPATITIS C RNA QUANTITATIVE

## 2019-05-01 LAB — HIV ANTIBODY (ROUTINE TESTING W REFLEX): HIV 1&2 Ab, 4th Generation: NONREACTIVE

## 2019-05-01 LAB — HEPATITIS B SURFACE ANTIGEN: Hepatitis B Surface Ag: NONREACTIVE

## 2019-05-01 LAB — HEPATITIS A ANTIBODY, TOTAL: Hepatitis A AB,Total: NONREACTIVE

## 2019-05-01 LAB — HEPATITIS B CORE ANTIBODY, TOTAL: Hep B Core Total Ab: NONREACTIVE

## 2019-05-01 LAB — HEPATITIS B SURFACE ANTIBODY,QUALITATIVE: Hep B S Ab: NONREACTIVE

## 2019-05-01 LAB — PROTIME-INR
INR: 1
Prothrombin Time: 10.7 s (ref 9.0–11.5)

## 2019-05-01 LAB — HEPATITIS C GENOTYPE

## 2019-05-05 ENCOUNTER — Other Ambulatory Visit: Payer: Self-pay

## 2019-05-05 DIAGNOSIS — K7460 Unspecified cirrhosis of liver: Secondary | ICD-10-CM

## 2019-05-13 ENCOUNTER — Telehealth: Payer: Self-pay | Admitting: Gastroenterology

## 2019-05-13 NOTE — Telephone Encounter (Signed)
Spoke with the lab, they are aware that the test AB ordered for pt was cancelled. Pt is at the lab and will have lab needed drawn.

## 2019-05-13 NOTE — Telephone Encounter (Signed)
[phone] please call [facility] about an order for this patient

## 2019-05-17 LAB — HEPATITIS C RNA QUANTITATIVE
HCV Quantitative Log: 5.36 Log IU/mL — ABNORMAL HIGH
HCV RNA, PCR, QN: 229000 IU/mL — ABNORMAL HIGH

## 2019-06-01 ENCOUNTER — Telehealth: Payer: Self-pay | Admitting: Internal Medicine

## 2019-06-01 NOTE — Telephone Encounter (Signed)
Pt has questions about his labs and if we have the results. [phone]

## 2019-06-01 NOTE — Telephone Encounter (Signed)
Spoke with pt. We will be submitting for Hep C.

## 2019-06-08 NOTE — Telephone Encounter (Signed)
Received denial letter for coverage of Harvoni Ledipasvir/Sofosbuvir. It says the pt may be treated with Mavyret or Epclusa generic velpatasvir/sofobuvir. Please advise.

## 2019-06-09 NOTE — Telephone Encounter (Signed)
We can send in for Epclusa once daily X 12 weeks.

## 2019-06-09 NOTE — Telephone Encounter (Signed)
Noted. Faxed form for Epclusa. Waiting to hear back from [place].

## 2019-06-17 ENCOUNTER — Telehealth: Payer: Self-pay | Admitting: Gastroenterology

## 2019-06-17 NOTE — Telephone Encounter (Signed)
Spoke with pt. We received an approval letter today. The company should be contacting pt soon to schedule a delivery with our office. Pt is aware and we will notify pt when medication arrives.

## 2019-06-17 NOTE — Telephone Encounter (Signed)
Called [employer] per pts request, to let them know that pt isn't able to have phone calls until 2 pm. They are aware and will contact pt about his delivery after 2pm when they receive the approved PA from [facility]. Our office has received approval.

## 2019-06-17 NOTE — Telephone Encounter (Signed)
[phone]  Patient called and asked if his hep mediation will be delivered/or has been delivered.   Said that you can leave him a message if he does not answer

## 2019-07-02 NOTE — Telephone Encounter (Signed)
Received a call from [facility], pt's Hep C medication is scheduled for Delivery on 07/07/2019.

## 2019-07-06 ENCOUNTER — Telehealth: Payer: Self-pay | Admitting: Gastroenterology

## 2019-07-06 NOTE — Telephone Encounter (Signed)
Pt is aware that [facility] will be delivering his medication tomorrow. He said to call him after 2pm [phone]

## 2019-07-06 NOTE — Telephone Encounter (Signed)
Noted  

## 2019-07-07 ENCOUNTER — Telehealth: Payer: Self-pay | Admitting: Gastroenterology

## 2019-07-07 NOTE — Telephone Encounter (Signed)
Pt was calling to see if his medication had arrived yet. Please call [phone]

## 2019-07-07 NOTE — Telephone Encounter (Signed)
Pt's hep C Medication William Jackson has arrived this morning. Please advise on directions.

## 2019-07-08 NOTE — Telephone Encounter (Signed)
Will discuss with pt, when AB gives instructions for medication.

## 2019-07-09 ENCOUNTER — Other Ambulatory Visit: Payer: Self-pay

## 2019-07-09 DIAGNOSIS — B182 Chronic viral hepatitis C: Secondary | ICD-10-CM

## 2019-07-09 DIAGNOSIS — Z79899 Other long term (current) drug therapy: Secondary | ICD-10-CM

## 2019-07-09 NOTE — Telephone Encounter (Signed)
See separate phone note. We are waiting on instructions from [facility]

## 2019-07-09 NOTE — Telephone Encounter (Signed)
Noted. Pt will stop by tomorrow or Monday 07/13/19 to pick up medications. Will discuss how to take the medication and update pts medication list. Pt will need a 4 week apt and repeat labs in 4 weeks. Lab orders placed.

## 2019-07-09 NOTE — Telephone Encounter (Signed)
Please take Epclusa once daily, same time every day.   Make sure he is not on a PPI or other acid-reducing medications. We need to update his med list.  Call us before taking any over-the-counter medications. Do not take herbal supplements.  Needs CBC, CMP, INR, Hep C RNA at 4 weeks into treatment. Needs office visit at that time as well. Will repeat labs at 12 weeks and then at 3 months post-treatment.

## 2019-07-21 ENCOUNTER — Telehealth: Payer: Self-pay | Admitting: Gastroenterology

## 2019-07-21 NOTE — Telephone Encounter (Signed)
PATIENT  CALLED WANTING TO KNOW IF HIS HEP-C MEDICATION WILL PREVENT HIM FROM GETTING THE COVID VACCINATION

## 2019-07-22 NOTE — Telephone Encounter (Signed)
Please advise if the pt is ok to take vaccine

## 2019-07-23 NOTE — Telephone Encounter (Signed)
PLEASE CALL PT. It is ok for him to get the Covid vaccine.

## 2019-07-23 NOTE — Telephone Encounter (Signed)
Spoke with pt. Pt notified that he can take the Covid Vaccine.

## 2019-08-05 ENCOUNTER — Telehealth: Payer: Self-pay

## 2019-08-05 NOTE — Telephone Encounter (Signed)
Pts Hep C medication arrived. Pt is aware and will pick medication up.

## 2019-08-17 NOTE — Progress Notes (Signed)
Referring Provider: Rory Percy, MD Primary Care Physician:  Rory Percy, MD  Primary GI: Dr. Oneida Alar   Chief Complaint  Patient presents with  . Hepatitis C    f/u.     HPI:   William Jackson is a 34 y.o. 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 Started Epclusa in March 2021.   Taking epclusa once per day. Same time every day. Halfway through second box. About 6 weeks in. Just got second COVID shot and feeling tired today. Weaning off of suboxone. Feels like constipation may be getting worse. Not sure when he last had a good productive stool. OTC magnesium used to be helpful but now lost its effectiveness.   Has low-volume paper hematochezia in setting of straining.   Past Medical History:  Diagnosis Date  . Anxiety   . Atypical nevus 09/13/2016   Lower Right Abd(Mild), Lower Mid Back(Mild) and Lower Right Back(Mild)  . Constipation   . Lumbar spine strain   . Seizure [facility])     Past Surgical History:  Procedure Laterality Date  . none      Current Outpatient Medications  Medication Sig Dispense Refill  . ALPRAZolam (XANAX) 1 MG tablet Take 1 mg by mouth 3 (three) times daily.     . buprenorphine-naloxone (SUBOXONE) 8-2 mg SUBL SL tablet Place 0.5 tablets under the tongue 3 (three) times daily.    . Sofosbuvir-Velpatasvir (EPCLUSA) 400-100 MG TABS Take by mouth daily.     No current facility-administered medications for this visit.    Allergies as of 08/18/2019 - Review Complete 08/18/2019  Allergen Reaction Noted  . Penicillins  11/06/2012    Family History  Problem Relation Age of Onset  . Prostate cancer Father   . Colon cancer Neg Hx   . Colon polyps Neg Hx     Social History   Socioeconomic History  . Marital status: Single    Spouse name: Not on file  . Number of children: 1  . Years of education: 58  . Highest education level: Not on file  Occupational History    Comment: [employer],  works in Paper  Tobacco Use  . Smoking status: Current Every Day Smoker  . Smokeless tobacco: Never Used  . Tobacco comment: vapes  Substance and Sexual Activity  . Alcohol use: No  . Drug use: No  . Sexual activity: Not on file  Other Topics Concern  . Not on file  Social History Narrative  . Not on file   Social Determinants of Health   Financial Resource Strain:   . Difficulty of Paying Living Expenses:   Food Insecurity:   . Worried About Charity fundraiser in the Last Year:   . Arboriculturist in the Last Year:   Transportation Needs:   . Film/video editor (Medical):   Marland Kitchen Lack of Transportation (Non-Medical):   Physical Activity:   . Days of Exercise per Week:   . Minutes of Exercise per Session:   Stress:   . Feeling of Stress :   Social Connections:   . Frequency of Communication with Friends and Family:   . Frequency of Social Gatherings with Friends and Family:   . Attends Religious Services:   . Active Member of Clubs or Organizations:   . Attends Archivist Meetings:   Marland Kitchen Marital Status:     Review of Systems: Gen: Denies fever,  chills, anorexia. Denies fatigue, weakness, weight loss.  CV: Denies chest pain, palpitations, syncope, peripheral edema, and claudication. Resp: Denies dyspnea at rest, cough, wheezing, coughing up blood, and pleurisy. GI:see HPI Derm: Denies rash, itching, dry skin Psych: Denies depression, anxiety, memory loss, confusion. No homicidal or suicidal ideation.  Heme: Denies bruising, bleeding, and enlarged lymph nodes.  Physical Exam: BP 119/70   Pulse 92   Temp 97.8 F (36.6 C) (Oral)   Ht 6\' 4"  (1.93 m)   Wt 197 lb 12.8 oz (89.7 kg)   BMI 24.08 kg/m  General:   Alert and oriented. No distress noted. Pleasant and cooperative.  Head:  Normocephalic and atraumatic. Eyes:  Conjuctiva clear without scleral icterus. Mouth:  Mask in place Abdomen:  +BS, soft, non-tender and non-distended. No rebound or guarding.  No HSM or masses noted. Msk:  Symmetrical without gross deformities. Normal posture. Extremities:  Without edema. Neurologic:  Alert and  oriented x4 Psych:  Alert and cooperative. Normal mood and affect.  ASSESSMENT: [name] is a 34 y.o. male presenting today approximately 6 weeks into treatment for chronic Hep C genotype 1a with Epclusa. Fibrosis score approximately 2. Tolerating therapy. Needs Hep A/B vaccination.   Reporting constipation with associated low-volume hematochezia on tissue. Recommend colonoscopy once Hep C treatment is completed.  In interim, start on Linzess 145 mcg daily. Samples provided.  Will check labs today, repeat at end of treatment, and final labs at 3 months post treatment.    PLAN:   Continue Epclusa once daily  Linzess 145 mcg once daily: samples provided  CBC, CMP, INR, Hep C RNA today and at end of treatment. Repeat 3 months post-treatment as well.  Return in 6 weeks  Colonoscopy once treatment completed  Hep A/B vaccination rx provided. Will have done with PCP.    [name], PhD, ANP-BC [facility] Gastroenterology

## 2019-08-18 ENCOUNTER — Other Ambulatory Visit: Payer: Self-pay

## 2019-08-18 ENCOUNTER — Telehealth: Payer: Self-pay | Admitting: Emergency Medicine

## 2019-08-18 ENCOUNTER — Encounter: Payer: Self-pay | Admitting: Gastroenterology

## 2019-08-18 ENCOUNTER — Ambulatory Visit (INDEPENDENT_AMBULATORY_CARE_PROVIDER_SITE_OTHER): Payer: BC Managed Care – PPO | Admitting: Gastroenterology

## 2019-08-18 VITALS — BP 119/70 | HR 92 | Temp 97.8°F | Ht 76.0 in | Wt 197.8 lb

## 2019-08-18 DIAGNOSIS — B182 Chronic viral hepatitis C: Secondary | ICD-10-CM | POA: Diagnosis not present

## 2019-08-18 DIAGNOSIS — K5900 Constipation, unspecified: Secondary | ICD-10-CM | POA: Insufficient documentation

## 2019-08-18 NOTE — Progress Notes (Signed)
Cc'ed to pcp °

## 2019-08-18 NOTE — Patient Instructions (Signed)
Continue Epclusa as you are doing.   I have ordered routine blood work. We will repeat this at end of treatment and 3 months after treatment.  For constipation, start Linzess 1 capsule each morning on an empty stomach. You may have some loose stool starting out, but it should get better after a few days. If not, please let me know. We may need to titrate dose up or down.  Stop magnesium for now.   We will see you in about 6 weeks!  I enjoyed seeing you again today! As you know, I value our relationship and want to provide genuine, compassionate, and quality care. I welcome your feedback. If you receive a survey regarding your visit,  I greatly appreciate you taking time to fill this out. See you next time!  [name], PhD, ANP-BC [facility] Gastroenterology

## 2019-08-18 NOTE — Telephone Encounter (Signed)
Called stated he went to [facility] they offer the hep  vaccines but they stated he will have to wait 2 weeks due to recently getting the covid vaccine. Pt stated he can get the hep vaccine on  08/31/19 then 30 days for the second then 6 months for there 3rd

## 2019-08-20 ENCOUNTER — Telehealth: Payer: Self-pay

## 2019-08-20 NOTE — Telephone Encounter (Signed)
Spoke with pt and he would like to try the Linzess 72 mcg. Pt will stop by Monday for samples.

## 2019-08-20 NOTE — Telephone Encounter (Signed)
Pt was seen Tuesday 08/18/2019. Pt was given Linzess 145 mg. Pt normal doesn't eat breakfast and pts first meal is around lunch. Pt took Linzess on one day early in the morning without eating until lunch. Pt was sent home from work today due to having too many BMs. Pt got breakfast this morning from [place] and isn't sure if it was the uncooked food he ate prior to taking the Linzess. Pt isn't sure how he should take the Linzess if he doesn't eat breakfast. Pt says he doesn't feel the medicine is too strong because he has had productive bowel movements like this previously. Do you want pt to try the Low dose of Linzess?

## 2019-08-20 NOTE — Telephone Encounter (Signed)
Best time to take Linzess is on an empty stomach. Taking with food will increase risk of loose stool, especially if a fatty meal (like [place]). So, not eating breakfast isn't an issue at this point. He can still take it in the morning.  Likely needs lower dosage. We can try Linzess 72 mcg samples.

## 2019-08-24 ENCOUNTER — Telehealth: Payer: Self-pay | Admitting: Gastroenterology

## 2019-08-24 NOTE — Telephone Encounter (Signed)
[facility] called to say they will have patient's medication delivered to [place] on 08/26/2019

## 2019-08-26 LAB — COMPLETE METABOLIC PANEL WITH GFR
AG Ratio: 1.3 (calc) (ref 1.0–2.5)
ALT: 13 U/L (ref 9–46)
AST: 22 U/L (ref 10–40)
Albumin: 4.4 g/dL (ref 3.6–5.1)
Alkaline phosphatase (APISO): 44 U/L (ref 36–130)
BUN: 11 mg/dL (ref 7–25)
CO2: 30 mmol/L (ref 20–32)
Calcium: 9.2 mg/dL (ref 8.6–10.3)
Chloride: 103 mmol/L (ref 98–110)
Creat: 1 mg/dL (ref 0.60–1.35)
GFR, Est African American: 113 mL/min/{1.73_m2} (ref >=60)
GFR, Est Non African American: 97 mL/min/{1.73_m2} (ref >=60)
Globulin: 3.5 g/dL (calc) (ref 1.9–3.7)
Glucose, Bld: 91 mg/dL (ref 65–139)
Potassium: 4.4 mmol/L (ref 3.5–5.3)
Sodium: 139 mmol/L (ref 135–146)
Total Bilirubin: 0.6 mg/dL (ref 0.2–1.2)
Total Protein: 7.9 g/dL (ref 6.1–8.1)

## 2019-08-26 LAB — CBC WITH DIFFERENTIAL/PLATELET
Absolute Monocytes: 273 cells/uL (ref 200–950)
Basophils Absolute: 48 cells/uL (ref 0–200)
Basophils Relative: 1.1 %
Eosinophils Absolute: 110 cells/uL (ref 15–500)
Eosinophils Relative: 2.5 %
HCT: 39.3 % (ref 38.5–50.0)
Hemoglobin: 13 g/dL — ABNORMAL LOW (ref 13.2–17.1)
Lymphs Abs: 1870 cells/uL (ref 850–3900)
MCH: 29.2 pg (ref 27.0–33.0)
MCHC: 33.1 g/dL (ref 32.0–36.0)
MCV: 88.3 fL (ref 80.0–100.0)
MPV: 10.6 fL (ref 7.5–12.5)
Monocytes Relative: 6.2 %
Neutro Abs: 2099 cells/uL (ref 1500–7800)
Neutrophils Relative %: 47.7 %
Platelets: 169 10*3/uL (ref 140–400)
RBC: 4.45 10*6/uL (ref 4.20–5.80)
RDW: 13.1 % (ref 11.0–15.0)
Total Lymphocyte: 42.5 %
WBC: 4.4 10*3/uL (ref 3.8–10.8)

## 2019-08-26 LAB — PROTIME-INR
INR: 1
Prothrombin Time: 10.9 s (ref 9.0–11.5)

## 2019-08-26 LAB — HEPATITIS C RNA QUANTITATIVE
HCV Quantitative Log: <1.18 Log IU/mL — AB
HCV RNA, PCR, QN: <15 IU/mL — AB

## 2019-08-27 NOTE — Telephone Encounter (Signed)
Hep C medication arrived today 08/27/2019. Pt is aware and will pick it up on Monday 08/31/2019.

## 2019-08-31 ENCOUNTER — Telehealth: Payer: Self-pay | Admitting: Gastroenterology

## 2019-08-31 MED ORDER — LINACLOTIDE 72 MCG PO CAPS: 72.0000 ug | ORAL_CAPSULE | Freq: Every day | ORAL | 3 refills | Status: AC

## 2019-08-31 NOTE — Telephone Encounter (Signed)
Patient called in stating PA was required for his linzess and is not covered. He wants to know if an alternative is available.

## 2019-08-31 NOTE — Telephone Encounter (Signed)
Patient said the linzess samples worked and he would like a prescription sent to [facility]   They were the lowest dosage

## 2019-08-31 NOTE — Addendum Note (Signed)
Addended by: [name] on: 08/31/2019 02:29 PM   Modules accepted: Orders

## 2019-08-31 NOTE — Telephone Encounter (Signed)
Linzess sent to pharmacy

## 2019-08-31 NOTE — Telephone Encounter (Signed)
Pt picked up his Hep C medication. Pt also asked for another Hep A&B RX to take to his pharmacy. Script for Hep A&B vaccination given to pt. Pt also wants Linzess 72 mcg sent into his pharmacy.

## 2019-08-31 NOTE — Telephone Encounter (Signed)
Spoke with pt. Pt is aware that I will look out for PA. Pt was given a Linzess prescription savings card and it seems that the pharmacy wont accept it. Pt has taken several otc meds to help move his bowels. Pt also has his first Hep A & B vaccination today.

## 2019-09-01 NOTE — Telephone Encounter (Signed)
Covered medications on pts plan is Lactulose solution, Lubiprostone 24 mcg. Pt hasn't tried any prescription medications . PA was submitted. Waiting on an approval or denial.

## 2019-09-02 ENCOUNTER — Telehealth: Payer: Self-pay | Admitting: Gastroenterology

## 2019-09-02 MED ORDER — LUBIPROSTONE 24 MCG PO CAPS: 24.0000 ug | ORAL_CAPSULE | Freq: Two times a day (BID) | ORAL | 3 refills | Status: DC

## 2019-09-02 NOTE — Telephone Encounter (Signed)
Pt was notified that [name] was called in due to his insurance not covering the [place]. Pt also said he was at his PCP office today and she is going to do a PA on Linzess. Pt states his PCP needs him to have a urine test done but his insurance wont cover it. Pts PCP is asking that AB ab urine test [id number] to pts next labs so pts insurance will cover it.

## 2019-09-02 NOTE — Telephone Encounter (Signed)
I sent [name] 24 mcg po BID with food to pharmacy as this appears to be covered through his insurance.

## 2019-09-02 NOTE — Telephone Encounter (Signed)
[phone] please call patient about his labs   Has a question about a code that is used, and adding on a test

## 2019-09-02 NOTE — Addendum Note (Signed)
Addended by: [name] on: 09/02/2019 04:04 PM   Modules accepted: Orders

## 2019-09-02 NOTE — Telephone Encounter (Signed)
Linzess 72 mcg was denied through [employer]. Letter state that the only alternative that must be tried is [employer]. [employer] needs a separate PA approval request.

## 2019-09-03 NOTE — Telephone Encounter (Signed)
Called lmom

## 2019-09-03 NOTE — Telephone Encounter (Signed)
His insurance will likely not cover regardless of who orders it if they already are not covering. This urine test is for Buprenorphine and Metabolite Screen. We don't manage that.   He needs to call his insurance regarding this.

## 2019-09-03 NOTE — Telephone Encounter (Signed)
Left a detailed message for pt. Pt notified of AB's recommendations.

## 2019-09-04 NOTE — Telephone Encounter (Signed)
Received a PA request for [name]. PA was submitted through [email or web address]. waiting on an approval or denial.

## 2019-09-29 ENCOUNTER — Ambulatory Visit (INDEPENDENT_AMBULATORY_CARE_PROVIDER_SITE_OTHER): Payer: BC Managed Care – PPO | Admitting: Gastroenterology

## 2019-09-29 ENCOUNTER — Other Ambulatory Visit: Payer: Self-pay

## 2019-09-29 VITALS — BP 120/74 | HR 69 | Temp 97.1°F | Ht 75.5 in | Wt 195.6 lb

## 2019-09-29 DIAGNOSIS — K5900 Constipation, unspecified: Secondary | ICD-10-CM | POA: Diagnosis not present

## 2019-09-29 DIAGNOSIS — B182 Chronic viral hepatitis C: Secondary | ICD-10-CM

## 2019-09-29 NOTE — Progress Notes (Signed)
Referring Provider: Rory Percy, MD Primary Care Physician:  William Percy, MD Primary GI: Dr. Oneida Jackson   Chief Complaint  Patient presents with  . Hepatitis C    HPI:   William Jackson is a 34 y.o. 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 Started Epclusa in March 2021.     Hep A/B 2 out of 3 vaccination completed. Trying to wean off suboxone. Linzess 72 mcg every other day. Helps to balance out his bowel movements. Has 6 Epclusa doses left. Noted some bruising but not worse than normal. No jaundice, mental status changes, confusion, abdominal pain, N/V, GERD. He is excited about a potential new job opportunity/.   Past Medical History:  Diagnosis Date  . Anxiety   . Atypical nevus 09/13/2016   Lower Right Abd(Mild), Lower Mid Back(Mild) and Lower Right Back(Mild)  . Constipation   . Lumbar spine strain   . Seizure [facility])     Past Surgical History:  Procedure Laterality Date  . none      Current Outpatient Medications  Medication Sig Dispense Refill  . ALPRAZolam (XANAX) 1 MG tablet Take 1 mg by mouth 3 (three) times daily.     . buprenorphine-naloxone (SUBOXONE) 8-2 mg SUBL SL tablet Place 0.5 tablets under the tongue 3 (three) times daily.    Marland Kitchen linaclotide (LINZESS) 72 MCG capsule Take 1 capsule (72 mcg total) by mouth daily before breakfast. 90 capsule 3  . Sofosbuvir-Velpatasvir (EPCLUSA) 400-100 MG TABS Take by mouth daily.     No current facility-administered medications for this visit.    Allergies as of 09/29/2019 - Review Complete 09/29/2019  Allergen Reaction Noted  . Penicillins  11/06/2012    Family History  Problem Relation Age of Onset  . Prostate cancer Father   . Colon cancer Neg Hx   . Colon polyps Neg Hx     Social History   Socioeconomic History  . Marital status: Single    Spouse name: Not on file  . Number of children: 1  . Years of education: 55  . Highest education level: Not on  file  Occupational History    Comment: [employer], works in Paper  Tobacco Use  . Smoking status: Current Every Day Smoker  . Smokeless tobacco: Never Used  . Tobacco comment: vapes  Substance and Sexual Activity  . Alcohol use: No  . Drug use: No  . Sexual activity: Not on file  Other Topics Concern  . Not on file  Social History Narrative  . Not on file   Social Determinants of Health   Financial Resource Strain:   . Difficulty of Paying Living Expenses:   Food Insecurity:   . Worried About Charity fundraiser in the Last Year:   . Arboriculturist in the Last Year:   Transportation Needs:   . Film/video editor (Medical):   Marland Kitchen Lack of Transportation (Non-Medical):   Physical Activity:   . Days of Exercise per Week:   . Minutes of Exercise per Session:   Stress:   . Feeling of Stress :   Social Connections:   . Frequency of Communication with Friends and Family:   . Frequency of Social Gatherings with Friends and Family:   . Attends Religious Services:   . Active Member of Clubs or Organizations:   . Attends Archivist Meetings:   Marland Kitchen Marital Status:  Review of Systems: Gen: Denies fever, chills, anorexia. Denies fatigue, weakness, weight loss.  CV: Denies chest pain, palpitations, syncope, peripheral edema, and claudication. Resp: Denies dyspnea at rest, cough, wheezing, coughing up blood, and pleurisy. GI: see HPI  Derm: Denies rash, itching, dry skin Psych: Denies depression, anxiety, memory loss, confusion. No homicidal or suicidal ideation.  Heme: Denies bruising, bleeding, and enlarged lymph nodes.  Physical Exam: BP 120/74   Pulse 69   Temp (!) 97.1 F (36.2 C)   Ht 6' 3.5" (1.918 m)   Wt 195 lb 9.6 oz (88.7 kg)   BMI 24.13 kg/m  General:   Alert and oriented. No distress noted. Pleasant and cooperative.  Head:  Normocephalic and atraumatic. Eyes:  Conjuctiva clear without scleral icterus. Mouth:  Mask in place Abdomen:  +BS,  soft, non-tender and non-distended. No rebound or guarding. No HSM or masses noted. Msk:  Symmetrical without gross deformities. Normal posture. Extremities:  Without edema. Neurologic:  Alert and  oriented x4 Psych:  Alert and cooperative. Normal mood and affect.  ASSESSMENT: William Jackson is a 34 y.o. 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 Chronic constipation.  Hep C: complete 1 week. Has done well with treatment. Check labs in 1 week after last dose and then again in 3 months. No need for serial [place] as low fibrosis score.  Constipation: responding well to novel dosing of Linzess 72 mcg every other day.    PLAN:   Continue Epclusa  Hep C RNA, CMP, CBC next week  Continue Linzess 72 mcg every other day   Repeat labs in 3 months to document eradication  Return in 6 months  [name], PhD, [facility] Gastroenterology

## 2019-09-29 NOTE — Patient Instructions (Signed)
Please have labs done when you finish your last dose of Epclusa.  We will repeat labs in 3 months to ensure Hep C is completely eradicated.  We will see you in 6 months!  Good luck with the job!  I enjoyed seeing you again today! As you know, I value our relationship and want to provide genuine, compassionate, and quality care. I welcome your feedback. If you receive a survey regarding your visit,  I greatly appreciate you taking time to fill this out. See you next time!  [name], PhD, ANP-BC [facility] Gastroenterology

## 2019-09-29 NOTE — Addendum Note (Signed)
Addended by: Luanne Bras on: 09/29/2019 03:29 PM   Modules accepted: Orders

## 2019-09-29 NOTE — Telephone Encounter (Signed)
PA for [name] was approved. Approval letter will be scanned in chart.

## 2019-12-02 ENCOUNTER — Telehealth: Payer: Self-pay | Admitting: Internal Medicine

## 2019-12-02 ENCOUNTER — Other Ambulatory Visit: Payer: Self-pay

## 2019-12-02 DIAGNOSIS — B182 Chronic viral hepatitis C: Secondary | ICD-10-CM

## 2019-12-02 NOTE — Telephone Encounter (Signed)
Patient called asking to speak with the nurse. [phone] regarding his labs

## 2019-12-02 NOTE — Telephone Encounter (Signed)
Routing to Angie 

## 2019-12-02 NOTE — Telephone Encounter (Signed)
FYI to [name], NP:  Pt wanted [place] to know that he is just now getting around to having labs drawn.  He said he will go over to [facility] today to have them drawn.

## 2019-12-05 LAB — COMPLETE METABOLIC PANEL WITH GFR
AG Ratio: 1.6 (calc) (ref 1.0–2.5)
ALT: 15 U/L (ref 9–46)
AST: 29 U/L (ref 10–40)
Albumin: 4.5 g/dL (ref 3.6–5.1)
Alkaline phosphatase (APISO): 40 U/L (ref 36–130)
BUN: 14 mg/dL (ref 7–25)
CO2: 28 mmol/L (ref 20–32)
Calcium: 9.8 mg/dL (ref 8.6–10.3)
Chloride: 103 mmol/L (ref 98–110)
Creat: 1.06 mg/dL (ref 0.60–1.35)
GFR, Est African American: 105 mL/min/{1.73_m2} (ref >=60)
GFR, Est Non African American: 90 mL/min/{1.73_m2} (ref >=60)
Globulin: 2.9 g/dL (calc) (ref 1.9–3.7)
Glucose, Bld: 78 mg/dL (ref 65–99)
Potassium: 4.6 mmol/L (ref 3.5–5.3)
Sodium: 139 mmol/L (ref 135–146)
Total Bilirubin: 1.2 mg/dL (ref 0.2–1.2)
Total Protein: 7.4 g/dL (ref 6.1–8.1)

## 2019-12-05 LAB — CBC WITH DIFFERENTIAL/PLATELET
Absolute Monocytes: 308 cells/uL (ref 200–950)
Basophils Absolute: 49 cells/uL (ref 0–200)
Basophils Relative: 1.3 %
Eosinophils Absolute: 30 cells/uL (ref 15–500)
Eosinophils Relative: 0.8 %
HCT: 41 % (ref 38.5–50.0)
Hemoglobin: 13.6 g/dL (ref 13.2–17.1)
Lymphs Abs: 1737 cells/uL (ref 850–3900)
MCH: 30.4 pg (ref 27.0–33.0)
MCHC: 33.2 g/dL (ref 32.0–36.0)
MCV: 91.5 fL (ref 80.0–100.0)
MPV: 10.7 fL (ref 7.5–12.5)
Monocytes Relative: 8.1 %
Neutro Abs: 1676 cells/uL (ref 1500–7800)
Neutrophils Relative %: 44.1 %
Platelets: 167 10*3/uL (ref 140–400)
RBC: 4.48 10*6/uL (ref 4.20–5.80)
RDW: 13.1 % (ref 11.0–15.0)
Total Lymphocyte: 45.7 %
WBC: 3.8 10*3/uL (ref 3.8–10.8)

## 2019-12-05 LAB — HEPATITIS C RNA QUANTITATIVE
HCV RNA, PCR, QN (Log): <1.18 log IU/mL
HCV RNA, PCR, QN: <15 IU/mL

## 2019-12-07 ENCOUNTER — Telehealth: Payer: Self-pay | Admitting: Internal Medicine

## 2019-12-07 NOTE — Telephone Encounter (Signed)
Patient called requesting lab results

## 2019-12-08 NOTE — Telephone Encounter (Signed)
Routing to Anna

## 2019-12-08 NOTE — Telephone Encounter (Signed)
Called pt and made him aware that virus undetectable at end of treatment per [name], NP. He will need repeat labs in 3 months to document SVR (CBC, CMP, Hep C RNA) which are already ordered. Pt voiced understanding.

## 2019-12-08 NOTE — Telephone Encounter (Signed)
Addressed under result note 

## 2020-01-25 ENCOUNTER — Encounter: Payer: Self-pay | Admitting: *Deleted

## 2020-02-01 ENCOUNTER — Telehealth: Payer: Self-pay | Admitting: Internal Medicine

## 2020-02-01 NOTE — Telephone Encounter (Signed)
Patient called and said that his insurance changed and he needs another approval for his linzess prescription.

## 2020-02-01 NOTE — Telephone Encounter (Signed)
error 

## 2020-02-02 NOTE — Telephone Encounter (Signed)
Spoke with pt. New insurance is [place] [id number], [place] [id number], [employer] [id number].  PA was submitted through [email or web address]. waiting on an approval or denial.

## 2020-02-04 NOTE — Telephone Encounter (Signed)
Noted  

## 2020-02-04 NOTE — Telephone Encounter (Signed)
The PA that I submitted was cancelled on [employer]eds.com. called pts insurance [phone] and was told the PA I submitted was cancelled due to [facility] Dr. Candi Leash office completed a PA for Linzess and it was approved. lmom to discuss with pt.

## 2020-02-04 NOTE — Telephone Encounter (Signed)
Pt called back and was given the information regarding PA for Linzess.  He voiced understanding was happy that it had been approved by Dr. Lynder Parents office.  Routing to [place] as FYI.

## 2020-04-05 ENCOUNTER — Encounter: Payer: Self-pay | Admitting: Internal Medicine

## 2020-04-05 ENCOUNTER — Ambulatory Visit: Payer: BC Managed Care – PPO | Admitting: Nurse Practitioner

## 2020-04-05 NOTE — Progress Notes (Deleted)
Referring Provider: Rory Percy, MD Primary Care Physician:  Rory Percy, MD Primary GI:  Dr. Abbey Chatters  No chief complaint on file.   HPI:   William Jackson is a 34 y.o. male who presents for follow-up.  The patient was last seen in our office 09/29/2019 for chronic hepatitis C and constipation.  At that time noted history of chronic hepatitis C genotype Ia with fibrosis score likely F2.  Started on Epclusa March 2021 and completed about mid June 2021.  Hepatitis A/B 2 out of 3 vaccinations completed at that time.  Trying to wean off Suboxone.  Linzess 72 mcg every other day helping constipation.  No overt GI or hepatic symptoms.  Recommended finished Epclusa, check hep C RNA, CMP, CBC the following week for end of treatment labs.  Continue Linzess 72 mcg every other day, repeat labs in 3 months for SVR documentation, follow-up in 6 months.  End of treatment labs completed 12/02/2019 which found undetectable hepatitis C RNA, normal CMP, normal CBC.  SVR documentation labs have not been completed as of yet.  Today he states doing okay overall.  Past Medical History:  Diagnosis Date  . Anxiety   . Atypical nevus 09/13/2016   Lower Right Abd(Mild), Lower Mid Back(Mild) and Lower Right Back(Mild)  . Constipation   . Lumbar spine strain   . Seizure [facility])     Past Surgical History:  Procedure Laterality Date  . none      Current Outpatient Medications  Medication Sig Dispense Refill  . ALPRAZolam (XANAX) 1 MG tablet Take 1 mg by mouth 3 (three) times daily.     . buprenorphine-naloxone (SUBOXONE) 8-2 mg SUBL SL tablet Place 0.5 tablets under the tongue 3 (three) times daily.    Marland Kitchen linaclotide (LINZESS) 72 MCG capsule Take 1 capsule (72 mcg total) by mouth daily before breakfast. 90 capsule 3  . Sofosbuvir-Velpatasvir (EPCLUSA) 400-100 MG TABS Take by mouth daily.     No current facility-administered medications for this visit.    Allergies as of 04/05/2020 - Review Complete  09/29/2019  Allergen Reaction Noted  . Penicillins  11/06/2012    Family History  Problem Relation Age of Onset  . Prostate cancer Father   . Colon cancer Neg Hx   . Colon polyps Neg Hx     Social History   Socioeconomic History  . Marital status: Single    Spouse name: Not on file  . Number of children: 1  . Years of education: 95  . Highest education level: Not on file  Occupational History    Comment: [employer], works in Paper  Tobacco Use  . Smoking status: Current Every Day Smoker  . Smokeless tobacco: Never Used  . Tobacco comment: vapes  Substance and Sexual Activity  . Alcohol use: No  . Drug use: No  . Sexual activity: Not on file  Other Topics Concern  . Not on file  Social History Narrative  . Not on file   Social Determinants of Health   Financial Resource Strain: Not on file  Food Insecurity: Not on file  Transportation Needs: Not on file  Physical Activity: Not on file  Stress: Not on file  Social Connections: Not on file    Subjective:*** Review of Systems  Constitutional: Negative for chills, fever, malaise/fatigue and weight loss.  HENT: Negative for congestion and sore throat.   Respiratory: Negative for cough and shortness of breath.   Cardiovascular: Negative for chest pain and  palpitations.  Gastrointestinal: Negative for abdominal pain, blood in stool, diarrhea, melena, nausea and vomiting.  Musculoskeletal: Negative for joint pain and myalgias.  Skin: Negative for rash.  Neurological: Negative for dizziness and weakness.  Endo/Heme/Allergies: Does not bruise/bleed easily.  Psychiatric/Behavioral: Negative for depression. The patient is not nervous/anxious.   All other systems reviewed and are negative.    Objective: There were no vitals taken for this visit. Physical Exam Vitals and nursing note reviewed.  Constitutional:      General: He is not in acute distress.    Appearance: Normal appearance. He is not ill-appearing,  toxic-appearing or diaphoretic.  HENT:     Head: Normocephalic and atraumatic.     Nose: No congestion or rhinorrhea.  Eyes:     General: No scleral icterus. Cardiovascular:     Rate and Rhythm: Normal rate and regular rhythm.     Heart sounds: Normal heart sounds.  Pulmonary:     Effort: Pulmonary effort is normal.     Breath sounds: Normal breath sounds.  Abdominal:     General: Bowel sounds are normal. There is no distension.     Palpations: Abdomen is soft. There is no hepatomegaly, splenomegaly or mass.     Tenderness: There is no abdominal tenderness. There is no guarding or rebound.     Hernia: No hernia is present.  Musculoskeletal:     Cervical back: Neck supple.  Skin:    General: Skin is warm and dry.     Coloration: Skin is not jaundiced.     Findings: No bruising or rash.  Neurological:     General: No focal deficit present.     Mental Status: He is alert and oriented to person, place, and time. Mental status is at baseline.  Psychiatric:        Mood and Affect: Mood normal.        Behavior: Behavior normal.        Thought Content: Thought content normal.      Assessment:  ***   Plan: ***    Thank you for allowing [place] to participate in the care of William Jackson  Lolah Coghlan, DNP, AGNP-C Adult & Gerontological Nurse Practitioner [facility]   04/05/2020 12:52 PM   Disclaimer: This note was dictated with voice recognition software. Similar sounding words can inadvertently be transcribed and may not be corrected upon review.

## 2020-04-08 ENCOUNTER — Telehealth: Payer: Self-pay

## 2020-04-08 NOTE — Telephone Encounter (Signed)
Phoned and LM on the voicemail of the pt reminding him to do his labwork and to call our office if he has any questions or concerns.

## 2020-04-11 NOTE — Telephone Encounter (Signed)
Spoke with Sherren Mocha from [employer]. Sherren Mocha is requested pts last lab results. Sherren Mocha is aware that when pt collects labs, we will fax results to him

## 2020-09-23 IMAGING — US US ABDOMEN COMPLETE W/ ELASTOGRAPHY
1 series · 12 of 25 positions shown · non-contrast
Comparison: None

CLINICAL DATA: Chronic hepatitis-C without hepatic coma.

EXAM:
ULTRASOUND ABDOMEN
ULTRASOUND HEPATIC ELASTOGRAPHY
TECHNIQUE: Sonography of the upper abdomen was performed. In addition,
ultrasound elastography evaluation of the liver was performed. A
region of interest was placed within the right lobe of the liver.
Following application of a compressive sonographic pulse, tissue
compressibility was assessed. Multiple assessments were performed at
the selected site. Median tissue compressibility was determined.
Previously, hepatic stiffness was assessed by shear wave velocity.
Based on recently published [employer] in Ultrasound
consensus article, reporting is now recommended to be performed in
the SI units of pressure (kiloPascals) representing hepatic
stiffness/elasticity. The obtained result is compared to the
published reference standards. (cACLD= compensated Advanced Chronic
Liver Disease)

[Series 1: us abdomen complete w/ elastography · 12 of 97 slices shown]
[im 5/97]
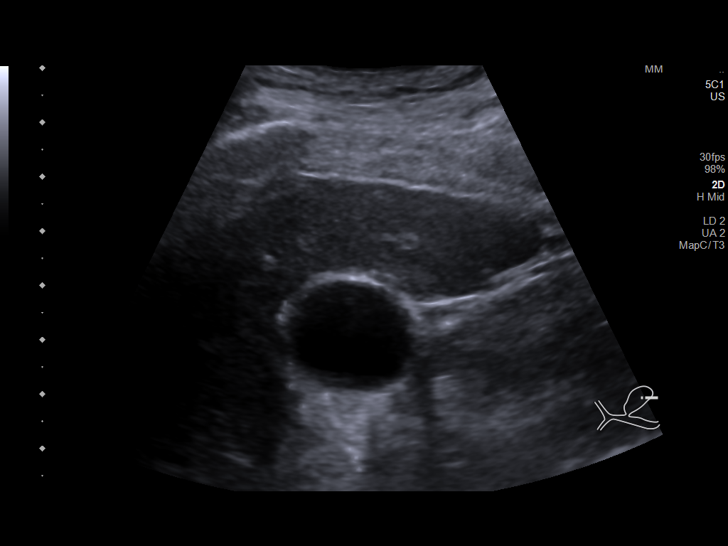
[im 13/97]
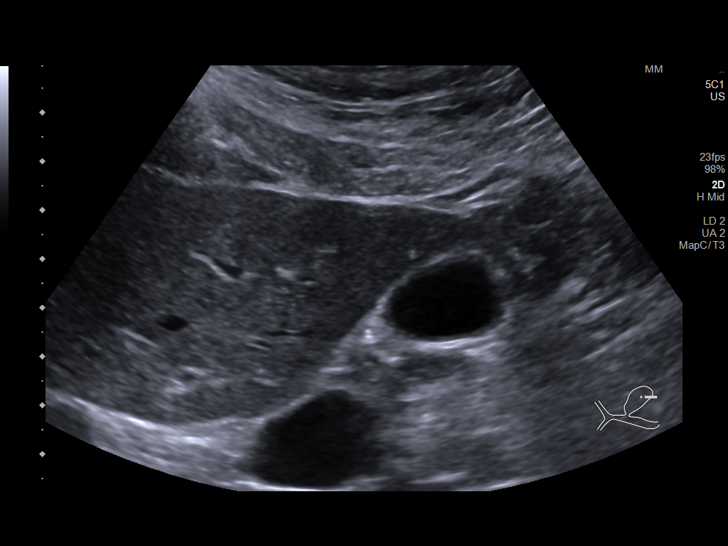
[im 21/97]
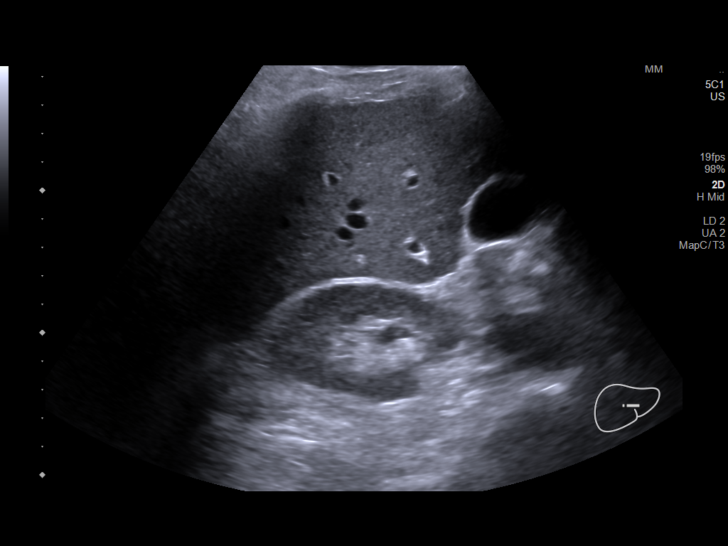
[im 29/97]
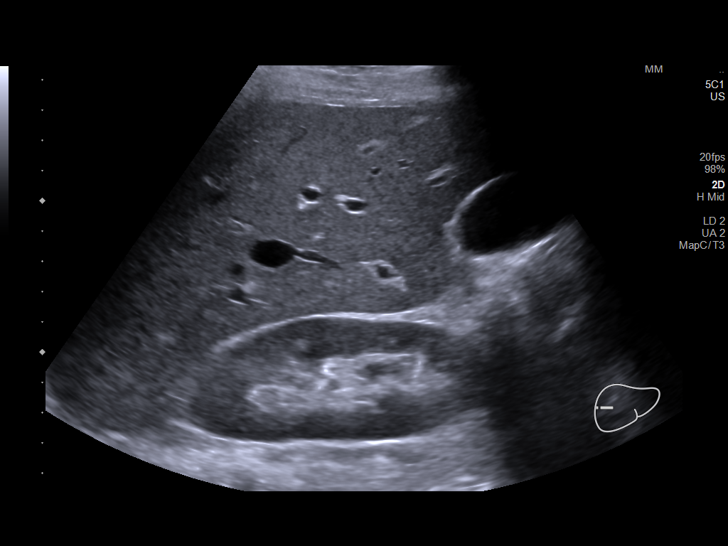
[im 37/97]
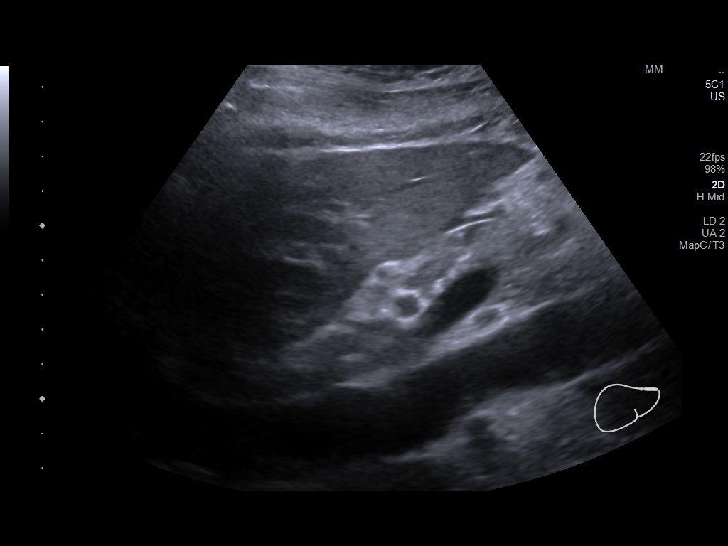
[im 45/97]
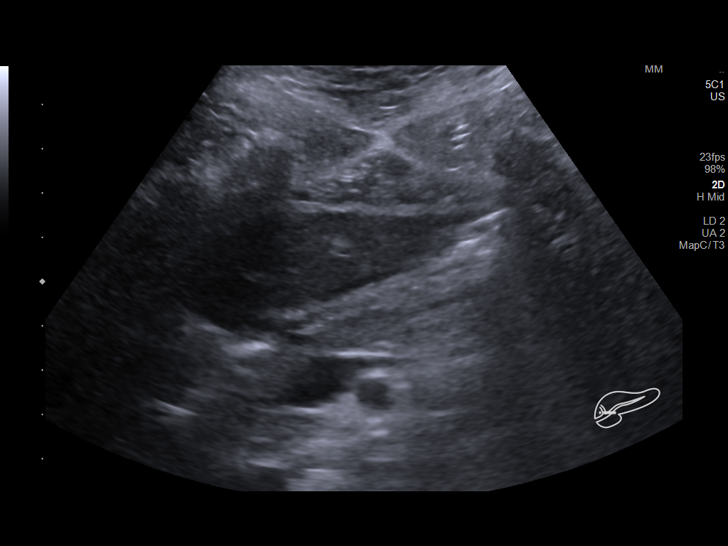
[im 53/97]
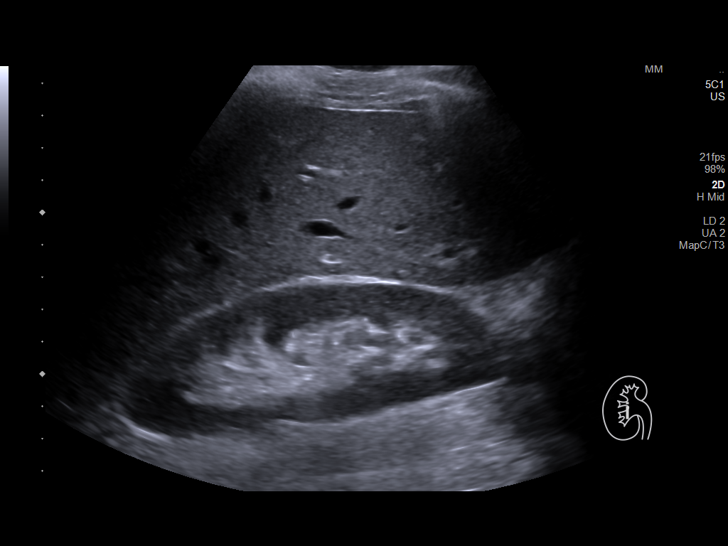
[im 61/97]
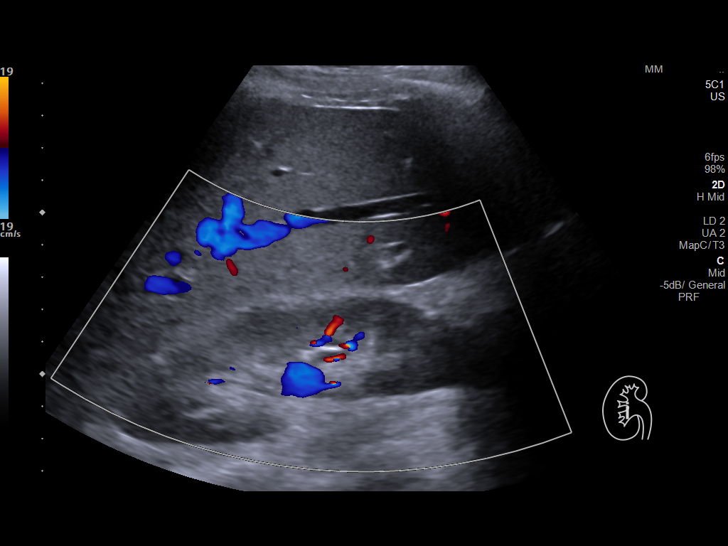
[im 69/97]
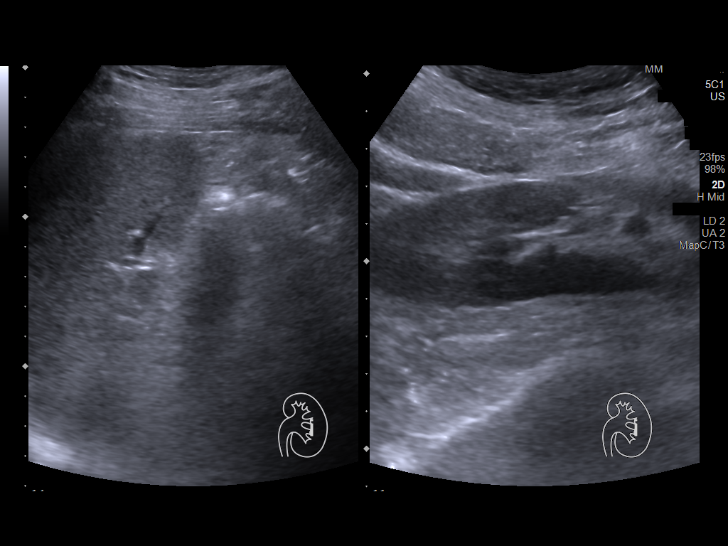
[im 77/97]
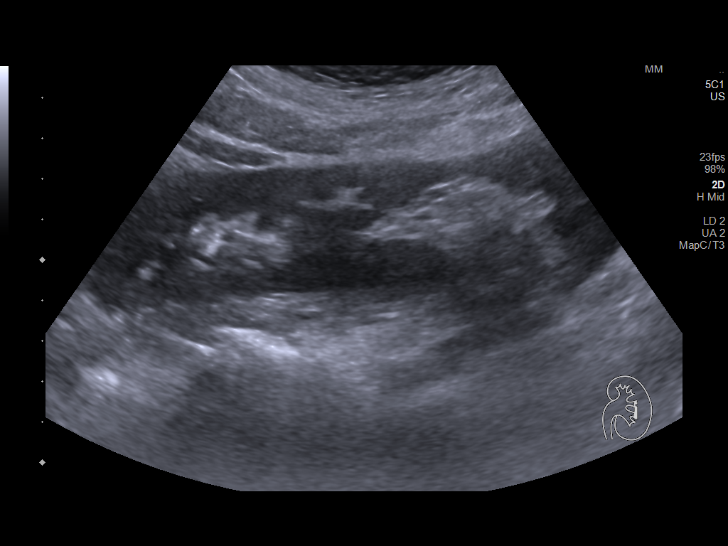
[im 85/97]
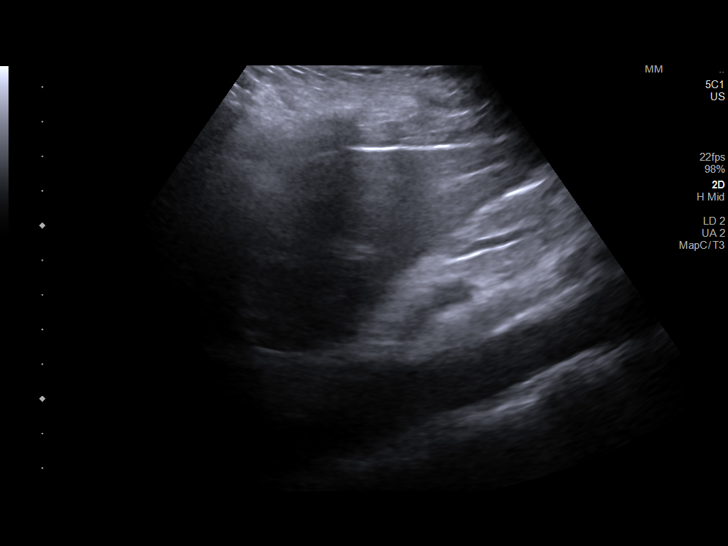
[im 93/97]
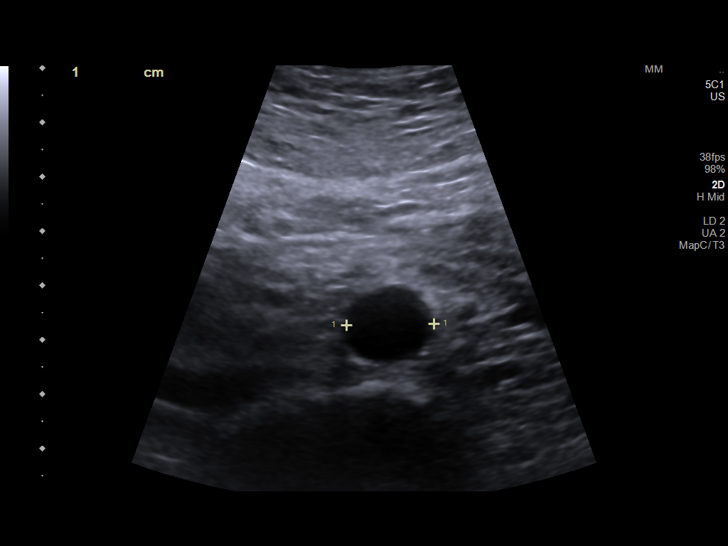

[12 of 25 positions shown; findings below may reference images not displayed]

FINDINGS: ULTRASOUND ABDOMEN

Gallbladder: No gallstones or wall thickening visualized. No
sonographic Murphy sign noted by sonographer.

Common bile duct: Diameter: 3 mm, within normal limits.

Liver: No focal lesion identified. Within normal limits in
parenchymal echogenicity. Portal vein is patent on color Doppler
imaging with normal direction of blood flow towards the liver.

IVC: No abnormality visualized.

Pancreas: Visualized portion unremarkable.

Spleen: Size and appearance within normal limits.

Right Kidney: Length: 12.4 cm. Echogenicity within normal limits. No
mass or hydronephrosis visualized.

Left Kidney: Length: 13.7 cm. Echogenicity within normal limits. No
mass or hydronephrosis visualized.

Abdominal aorta: No aneurysm visualized.

Other findings: None.

ULTRASOUND HEPATIC ELASTOGRAPHY

Device: Siemens Helix VTQ

Patient position: Oblique

Transducer [id number]

Number of measurements: 10

Hepatic segment:  8

Median kPa:

IQR:

IQR/Median kPa ratio:

Data quality:  Good

Diagnostic category: ?9 kPa: in the absence of other known clinical
signs, rules out cACLD
IMPRESSION: ULTRASOUND ABDOMEN:

Normal study.  No hepatobiliary abnormality identified.

ULTRASOUND HEPATIC ELASTOGRAPHY:

Median kPa:

Diagnostic category: ?9 kPa: in the absence of other known clinical
signs, rules out cACLD

The use of hepatic elastography is applicable to patients with viral
hepatitis and non-alcoholic fatty liver disease. At this time, there
is insufficient data for the referenced cut-off values and use in
other causes of liver disease, including alcoholic liver disease.
Patients, however, may be assessed by elastography and serve as
their own reference standard/baseline.

In patients with non-alcoholic liver disease, the values suggesting
compensated advanced chronic liver disease (cACLD) may be lower, and
patients may need additional testing with elasticity results of [DATE]
kPa.

Please note that abnormal hepatic elasticity and shear wave
velocities may also be identified in clinical settings other than
with hepatic fibrosis, such as: acute hepatitis, elevated right
heart and central venous pressures including use of beta blockers,
Sevgjinaj disease (Flash), infiltrative processes such as
mastocytosis/amyloidosis/infiltrative tumor/lymphoma, extrahepatic
cholestasis, with hyperemia in the post-prandial state, and with
liver transplantation. Correlation with patient history, laboratory
data, and clinical condition recommended.

Diagnostic Categories:

?5 kPa: high probability of being normal

?9 kPa: in the absence of other known clinical signs, rules [DATE] kPa and ?13 kPa: suggestive of cACLD, but needs further testing

>13 kPa: highly suggestive of cACLD

?17 kPa: highly suggestive of cACLD with an increased probability of
clinically significant portal hypertension
